# Patient Record
Sex: Female | Born: 1995 | Race: White | Hispanic: No | Marital: Single | Smoking: Never smoker
Health system: Southern US, Community
[De-identification: ages and names within clinical notes are randomized; demographics above are authoritative.]

## PROBLEM LIST (undated history)

## (undated) DIAGNOSIS — F419 Anxiety disorder, unspecified: Secondary | ICD-10-CM

## (undated) DIAGNOSIS — F32A Depression, unspecified: Secondary | ICD-10-CM

## (undated) DIAGNOSIS — G43909 Migraine, unspecified, not intractable, without status migrainosus: Secondary | ICD-10-CM

## (undated) HISTORY — PX: WISDOM TOOTH EXTRACTION: SHX21

---
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
## 2015-05-20 DIAGNOSIS — R39198 Other difficulties with micturition: Secondary | ICD-10-CM | POA: Diagnosis not present

## 2015-05-20 DIAGNOSIS — Z1389 Encounter for screening for other disorder: Secondary | ICD-10-CM | POA: Diagnosis not present

## 2015-05-20 DIAGNOSIS — R3919 Other difficulties with micturition: Secondary | ICD-10-CM | POA: Diagnosis not present

## 2015-05-20 DIAGNOSIS — Z68.41 Body mass index (BMI) pediatric, less than 5th percentile for age: Secondary | ICD-10-CM | POA: Diagnosis not present

## 2015-06-24 DIAGNOSIS — J301 Allergic rhinitis due to pollen: Secondary | ICD-10-CM | POA: Diagnosis not present

## 2015-06-24 DIAGNOSIS — Z1389 Encounter for screening for other disorder: Secondary | ICD-10-CM | POA: Diagnosis not present

## 2015-06-24 DIAGNOSIS — J019 Acute sinusitis, unspecified: Secondary | ICD-10-CM | POA: Diagnosis not present

## 2015-06-24 DIAGNOSIS — Z68.41 Body mass index (BMI) pediatric, less than 5th percentile for age: Secondary | ICD-10-CM | POA: Diagnosis not present

## 2015-08-18 DIAGNOSIS — H5200 Hypermetropia, unspecified eye: Secondary | ICD-10-CM | POA: Diagnosis not present

## 2015-08-18 DIAGNOSIS — H52229 Regular astigmatism, unspecified eye: Secondary | ICD-10-CM | POA: Diagnosis not present

## 2015-08-21 DIAGNOSIS — Z1389 Encounter for screening for other disorder: Secondary | ICD-10-CM | POA: Diagnosis not present

## 2015-08-21 DIAGNOSIS — Z0000 Encounter for general adult medical examination without abnormal findings: Secondary | ICD-10-CM | POA: Diagnosis not present

## 2015-08-21 DIAGNOSIS — H18419 Arcus senilis, unspecified eye: Secondary | ICD-10-CM | POA: Diagnosis not present

## 2015-08-21 DIAGNOSIS — Z68.41 Body mass index (BMI) pediatric, less than 5th percentile for age: Secondary | ICD-10-CM | POA: Diagnosis not present

## 2015-10-23 DIAGNOSIS — Z68.41 Body mass index (BMI) pediatric, less than 5th percentile for age: Secondary | ICD-10-CM | POA: Diagnosis not present

## 2015-10-23 DIAGNOSIS — Z23 Encounter for immunization: Secondary | ICD-10-CM | POA: Diagnosis not present

## 2015-10-23 DIAGNOSIS — Z1389 Encounter for screening for other disorder: Secondary | ICD-10-CM | POA: Diagnosis not present

## 2015-10-23 DIAGNOSIS — Z124 Encounter for screening for malignant neoplasm of cervix: Secondary | ICD-10-CM | POA: Diagnosis not present

## 2015-10-23 DIAGNOSIS — Z0000 Encounter for general adult medical examination without abnormal findings: Secondary | ICD-10-CM | POA: Diagnosis not present

## 2015-10-26 DIAGNOSIS — Z23 Encounter for immunization: Secondary | ICD-10-CM | POA: Diagnosis not present

## 2015-10-26 DIAGNOSIS — Z1389 Encounter for screening for other disorder: Secondary | ICD-10-CM | POA: Diagnosis not present

## 2015-10-26 DIAGNOSIS — Z0000 Encounter for general adult medical examination without abnormal findings: Secondary | ICD-10-CM | POA: Diagnosis not present

## 2015-11-04 ENCOUNTER — Telehealth: Payer: Self-pay | Admitting: Family Medicine

## 2015-11-04 NOTE — Telephone Encounter (Signed)
Needs copy of shot records.  Has currently enrolled in nursing school.  Please keep and see if there are any records

## 2015-11-04 NOTE — Telephone Encounter (Signed)
Please call and let patient know shot record is placed up front of pick up.  She need a second MMR and Varicella, need to check with school to make sure if she need those immunizations.  Derl Barrow, RN

## 2015-12-28 DIAGNOSIS — Z0000 Encounter for general adult medical examination without abnormal findings: Secondary | ICD-10-CM | POA: Diagnosis not present

## 2016-03-08 DIAGNOSIS — R51 Headache: Secondary | ICD-10-CM | POA: Diagnosis not present

## 2016-03-08 DIAGNOSIS — Z68.41 Body mass index (BMI) pediatric, less than 5th percentile for age: Secondary | ICD-10-CM | POA: Diagnosis not present

## 2016-03-08 DIAGNOSIS — J302 Other seasonal allergic rhinitis: Secondary | ICD-10-CM | POA: Diagnosis not present

## 2016-03-08 MED FILL — TRAMADOL-APAP 37.5-325 TAB: 37.5-325 | 10 days supply | Qty: 60 | Fill #0

## 2016-11-03 DIAGNOSIS — R51 Headache: Secondary | ICD-10-CM | POA: Diagnosis not present

## 2016-11-03 DIAGNOSIS — Z68.41 Body mass index (BMI) pediatric, less than 5th percentile for age: Secondary | ICD-10-CM | POA: Diagnosis not present

## 2016-11-03 DIAGNOSIS — Z308 Encounter for other contraceptive management: Secondary | ICD-10-CM | POA: Diagnosis not present

## 2016-11-03 DIAGNOSIS — Z1389 Encounter for screening for other disorder: Secondary | ICD-10-CM | POA: Diagnosis not present

## 2016-11-04 DIAGNOSIS — Z113 Encounter for screening for infections with a predominantly sexual mode of transmission: Secondary | ICD-10-CM | POA: Diagnosis not present

## 2016-11-21 DIAGNOSIS — Z23 Encounter for immunization: Secondary | ICD-10-CM | POA: Diagnosis not present

## 2016-12-21 DIAGNOSIS — Z6820 Body mass index (BMI) 20.0-20.9, adult: Secondary | ICD-10-CM | POA: Diagnosis not present

## 2016-12-21 DIAGNOSIS — Z1389 Encounter for screening for other disorder: Secondary | ICD-10-CM | POA: Diagnosis not present

## 2016-12-21 DIAGNOSIS — G44229 Chronic tension-type headache, not intractable: Secondary | ICD-10-CM | POA: Diagnosis not present

## 2016-12-30 ENCOUNTER — Ambulatory Visit (HOSPITAL_COMMUNITY)
Admission: EM | Admit: 2016-12-30 | Discharge: 2016-12-30 | Disposition: A | Discharge status: Home / Self Care | Payer: 59 | Attending: Family Medicine | Admitting: Family Medicine

## 2016-12-30 ENCOUNTER — Encounter (HOSPITAL_COMMUNITY): Payer: Self-pay | Admitting: Emergency Medicine

## 2016-12-30 ENCOUNTER — Ambulatory Visit (INDEPENDENT_AMBULATORY_CARE_PROVIDER_SITE_OTHER): Payer: 59

## 2016-12-30 DIAGNOSIS — W19XXXA Unspecified fall, initial encounter: Secondary | ICD-10-CM

## 2016-12-30 DIAGNOSIS — M79671 Pain in right foot: Secondary | ICD-10-CM

## 2016-12-30 MED ORDER — NAPROXEN 500 MG PO TABS: 500.0000 mg | ORAL_TABLET | Freq: Two times a day (BID) | ORAL | 0 refills | Status: AC

## 2016-12-30 NOTE — ED Triage Notes (Signed)
PT fell down 3 stairs last night and injured right foot. PT reports pain under arch and pain in right 5th toe.

## 2016-12-30 NOTE — ED Provider Notes (Signed)
[facility]    CSN: [id number] Arrival date & time: 12/30/16  1601     History   Chief Complaint Chief Complaint  Patient presents with  . Fall    HPI Diane Bautista is a 21 y.o. female.   21 year old female comes in for right 5th toe and arch pain after falling off the stairs yesterday. She states it was down carpeted stairs and she was not wearing shoes at the time. There is some swelling of the fifth toe with contusion seen. She has not taken anything for the pain. Would like an x-ray to make sure there is no fractures or dislocations. She is able to bear weight, walk without a problem.      History reviewed. No pertinent past medical history.  There are no active problems to display for this patient.   History reviewed. No pertinent surgical history.  OB History    No data available       Home Medications    Prior to Admission medications   Medication Sig Start Date End Date Taking? Authorizing Provider  amitriptyline (ELAVIL) 25 MG tablet Take 25 mg by mouth at bedtime.   Yes [provider]  naproxen (NAPROSYN) 500 MG tablet Take 1 tablet (500 mg total) by mouth 2 (two) times daily. 12/30/16 01/09/17  Belinda Fisher, PA-C    Family History No family history on file.  Social History Social History  Substance Use Topics  . Smoking status: Never Smoker  . Smokeless tobacco: Never Used  . Alcohol use Yes     Allergies   Patient has no known allergies.   Review of Systems Review of Systems  Musculoskeletal: Positive for arthralgias, joint swelling and myalgias. Negative for back pain and gait problem.  Skin: Positive for color change. Negative for wound.     Physical Exam Triage Vital Signs ED Triage Vitals  Enc Vitals Group     BP 12/30/16 1628 109/69     Pulse Rate 12/30/16 1628 91     Resp 12/30/16 1628 16     Temp 12/30/16 1628 98.8 F (37.1 C)     Temp Source 12/30/16 1628 Oral     SpO2 12/30/16 1628 100 %     Weight  12/30/16 1626 110 lb (49.9 kg)     Height 12/30/16 1626 5\' 2"  (1.575 m)     Head Circumference --      Peak Flow --      Pain Score 12/30/16 1626 2     Pain Loc --      Pain Edu? --      Excl. in GC? --    No data found.   Updated Vital Signs BP 109/69   Pulse 91   Temp 98.8 F (37.1 C) (Oral)   Resp 16   Ht 5\' 2"  (1.575 m)   Wt 110 lb (49.9 kg)   LMP 12/16/2016   SpO2 100%   BMI 20.12 kg/m      Physical Exam  Constitutional: She is oriented to person, place, and time. She appears well-developed and well-nourished. No distress.  Musculoskeletal:  Right fifth toe swelling and contusion noted. Mild pain on palpation of the right fifth toe and right fifth metatarsal. Full range of motion of the toes. Pedal pulses 2+ and equal bilaterally.  Neurological: She is alert and oriented to person, place, and time.     UC Treatments / Results  Labs (all labs ordered are listed,  but only abnormal results are displayed) Labs Reviewed - No data to display  EKG  EKG Interpretation None       Radiology Dg Foot Complete Right  Result Date: 12/30/2016 CLINICAL DATA:  21 year old female with right foot pain status post fall. EXAM: RIGHT FOOT COMPLETE - 3+ VIEW COMPARISON:  Radiographs of the right ankle 04/24/2008 FINDINGS: There is no evidence of fracture or dislocation. There is no evidence of arthropathy or other focal bone abnormality. Soft tissues are unremarkable. IMPRESSION: Negative. Electronically Signed   By: Sande Brothers M.D.   On: 12/30/2016 17:07    Procedures Procedures (including critical care time)  Medications Ordered in UC Medications - No data to display   Initial Impression / Assessment and Plan / UC Course  I have reviewed the triage vital signs and the nursing notes.  Pertinent labs & imaging results that were available during my care of the patient were reviewed by me and considered in my medical decision making (see chart for details).      Discussed risks and benefits of xray, as patient is not highly symptomatic, patient would still like to proceed with xray. X-ray negative for dislocations or fracture. Start NSAIDs as directed. Ice/heat compress, elevation. Ace wrap during activity. Discussed with patient this can take up to 2-3 weeks to completely resolve, but should be feeling better each week. Return precautions given.  Final Clinical Impressions(s) / UC Diagnoses   Final diagnoses:  Fall, initial encounter  Right foot pain    New Prescriptions New Prescriptions   NAPROXEN (NAPROSYN) 500 MG TABLET    Take 1 tablet (500 mg total) by mouth 2 (two) times daily.       Smithie, Ackerman, PA-C 12/30/16 1735

## 2016-12-30 NOTE — Discharge Instructions (Signed)
X-ray negative for fractures or dislocation. Start naproxen as directed. Ice compress and elevation. Compression during activity. This can take up to 2-3 weeks to completely resolve, but he should be feeling better each week. If experiencing worsening of symptoms, increased numbness/tingling, increased swelling, follow-up with PCP for further evaluation and treatment needed.

## 2017-01-31 DIAGNOSIS — Z111 Encounter for screening for respiratory tuberculosis: Secondary | ICD-10-CM | POA: Diagnosis not present

## 2017-03-09 DIAGNOSIS — G43009 Migraine without aura, not intractable, without status migrainosus: Secondary | ICD-10-CM | POA: Diagnosis not present

## 2017-03-09 DIAGNOSIS — R42 Dizziness and giddiness: Secondary | ICD-10-CM | POA: Diagnosis not present

## 2017-03-09 DIAGNOSIS — Z6820 Body mass index (BMI) 20.0-20.9, adult: Secondary | ICD-10-CM | POA: Diagnosis not present

## 2017-03-09 DIAGNOSIS — Z1389 Encounter for screening for other disorder: Secondary | ICD-10-CM | POA: Diagnosis not present

## 2017-09-26 ENCOUNTER — Ambulatory Visit: Payer: Self-pay | Admitting: General Surgery

## 2017-10-02 DIAGNOSIS — H6123 Impacted cerumen, bilateral: Secondary | ICD-10-CM | POA: Diagnosis not present

## 2017-10-02 DIAGNOSIS — Z6820 Body mass index (BMI) 20.0-20.9, adult: Secondary | ICD-10-CM | POA: Diagnosis not present

## 2017-10-17 ENCOUNTER — Ambulatory Visit: Payer: 59 | Admitting: General Surgery

## 2017-10-17 ENCOUNTER — Encounter: Payer: Self-pay | Admitting: General Surgery

## 2017-10-17 VITALS — BP 111/63 | HR 88 | Temp 98.0°F | Resp 16 | Wt 111.0 lb

## 2017-10-17 DIAGNOSIS — N6310 Unspecified lump in the right breast, unspecified quadrant: Secondary | ICD-10-CM

## 2017-10-17 NOTE — Patient Instructions (Signed)
Fibroadenoma Fibroadenoma is a type of breast tumor that is not cancerous (is benign). These tumors are made up of breast tissue and the tissue that holds breast tissue together (connective tissue). There are several types of fibroadenomas:  Simple fibroadenoma. This is the most common type. It consists of a single type of tissue throughout the tumor.  Complex fibroadenoma. This type of tumor contains more than one kind of tissue or irregular tissue.  Juvenile fibroadenoma. This is a type of tumor that can develop in adolescent girls. It tends to grow larger over time than other adenomas.  A fibroadenoma usually occurs as a single lump, but sometimes there may be more than one lump. Fibroadenomas vary in size. They can occur in one breast or in both breasts. Some fibroadenomas are too small to feel, but a larger one may feel like a firm, smooth lump that moves beneath your fingers. Although fibroadenomas are not cancer, having a fibroadenoma may slightly increase your risk for developing breast cancer in the future. What are the causes? The exact cause of fibroadenoma is not known. What increases the risk? This condition is more likely to develop in:  Women who are 20-30 years of age.  Women of African-American descent.  What are the signs or symptoms? A fibroadenoma may not cause any symptoms. These tumors usually do not cause pain unless they grow to a large size. A fibroadenoma may feel like a lump in your breast that is:  Firm.  Round.  Smooth.  Slightly moveable.  How is this diagnosed? You may notice a breast lump during a breast self-exam. Your health care provider may discover it during a routine breast exam or mammogram. Your health care provider may suspect fibroadenoma if you have a breast lump that feels firm, round, and smooth and appears smooth on your mammogram. Other tests may be done to confirm the diagnosis, including:  An ultrasound to check for fluid inside the  lump (cystic tumor).  A procedure that uses a needle to remove fluid from a cystic tumor. The fluid is then checked under a microscope for cancer cells.  A mammogram to examine a lump that is not cystic (is solid).  A procedure that uses a needle to remove a sample of tissue from the lump (breast biopsy) to examine under a microscope. This test is the only method that can be used to confirm that a tumor is a fibroadenoma and is not cancer.  How is this treated? Treatment for this condition may include:  Having breast exams regularly to check for changes in your fibroadenoma.  Having the fibroadenoma removed. A fibroadenoma may be removed if it is: ? Large. ? Continuing to grow. ? Causing symptoms. ? Changing the appearance of your breast. ? A juvenile fibroadenoma. These tend to grow large over time.  Follow these instructions at home:   If you had a fibroadenoma removed, follow instructions from your health care provider for care after the procedure.  Perform breast self-exams at home as told by your health care provider.  Keep all follow-up visits as told by your health care provider. This is important. Contact a health care provider if:  Your fibroadenoma becomes larger, feels different, or becomes painful.  You find a new breast lump.  You have any changes in the skin that covers your breast. These include: ? Dimpling. ? Bruising. ? Thickening. ? Redness.  You have any changes in your nipple.  You have fluid leaking from your nipple. This   information is not intended to replace advice given to you by your health care provider. Make sure you discuss any questions you have with your health care provider. Document Released: 09/16/2014 Document Revised: 10/08/2015 Document Reviewed: 04/23/2014 Elsevier Interactive Patient Education  2018 [employer]vier Inc.  

## 2017-10-17 NOTE — Progress Notes (Signed)
Diane Bautista; [id number]; 06/03/1995   HPI Patient is a 22 year old white female who was referred to my care by Dr. Sherwood GamblerFusco for evaluation and treatment of a right breast mass.  Patient found the mass approximately 4 weeks ago.  Is nontender to touch.  No nipple discharge has been noted.  There is no immediate family history of breast cancer.  Patient denies any pain in the right breast.  Her menstrual cycles have been normal. History reviewed. No pertinent past medical history.  History reviewed. No pertinent surgical history.  History reviewed. No pertinent family history.  Current Outpatient Medications on File Prior to Visit  Medication Sig Dispense Refill  . rizatriptan (MAXALT) 5 MG tablet Take 5 mg by mouth as needed for migraine. May repeat in 2 hours if needed    . amitriptyline (ELAVIL) 25 MG tablet Take 25 mg by mouth at bedtime.     No current facility-administered medications on file prior to visit.     No Known Allergies  Social History   Substance and Sexual Activity  Alcohol Use Yes    Social History   Tobacco Use  Smoking Status Never Smoker  Smokeless Tobacco Never Used    Review of Systems  Constitutional: Negative.   HENT: Negative.   Eyes: Negative.   Respiratory: Negative.   Cardiovascular: Negative.   Gastrointestinal: Negative.   Genitourinary: Negative.   Musculoskeletal: Negative.   Skin: Negative.   Neurological: Negative.   Endo/Heme/Allergies: Negative.   Psychiatric/Behavioral: Negative.    Objective   Vitals:   10/17/17 0929  BP: 111/63  Pulse: 88  Resp: 16  Temp: 98 F (36.7 C)    Physical Exam  Constitutional: She is oriented to person, place, and time. She appears well-developed and well-nourished.  HENT:  Head: Normocephalic and atraumatic.  Cardiovascular: Normal rate, regular rhythm and normal heart sounds. Exam reveals no gallop and no friction rub.  No murmur heard. Pulmonary/Chest: Effort normal and breath sounds  normal. No stridor. No respiratory distress. She has no wheezes. She has no rales.  Neurological: She is alert and oriented to person, place, and time.  Skin: Skin is warm and dry.  Vitals reviewed. Breast: Right breast examination reveals a smaller than pea-sized mobile, well-circumscribed nodule at the 6 o'clock position in the right breast.  It is nontender to touch.  No nipple discharge or dimpling is noted.  The axilla is negative for palpable nodes.  Left breast examination reveals no dominant mass, nipple discharge, dimpling.  The axilla was negative for palpable nodes. Dr. Sharyon MedicusFusco's notes reviewed.  Assessment  Benign right breast mass, probable resolved cyst or fibroadenoma Plan   I reassured the patient that this is a benign finding.  I did offer excision if she wanted, but she does not.  She will follow the lump and return to my care should there be any increase in size.  Mammogram and ultrasound will not be beneficial at this point given her age and the small size of the lump.  Follow-up as needed.

## 2019-02-22 ENCOUNTER — Ambulatory Visit (HOSPITAL_COMMUNITY)
Admission: EM | Admit: 2019-02-22 | Discharge: 2019-02-22 | Disposition: A | Discharge status: Home / Self Care | Payer: No Typology Code available for payment source | Attending: Emergency Medicine | Admitting: Emergency Medicine

## 2019-02-22 ENCOUNTER — Other Ambulatory Visit: Payer: Self-pay

## 2019-02-22 ENCOUNTER — Encounter (HOSPITAL_COMMUNITY): Payer: Self-pay | Admitting: Emergency Medicine

## 2019-02-22 DIAGNOSIS — N939 Abnormal uterine and vaginal bleeding, unspecified: Secondary | ICD-10-CM | POA: Diagnosis not present

## 2019-02-22 DIAGNOSIS — Z3202 Encounter for pregnancy test, result negative: Secondary | ICD-10-CM

## 2019-02-22 LAB — POCT PREGNANCY, URINE: Preg Test, Ur: NEGATIVE

## 2019-02-22 MED ORDER — NAPROXEN 500 MG PO TABS: 500.0000 mg | ORAL_TABLET | Freq: Two times a day (BID) | ORAL | 0 refills | Status: AC

## 2019-02-22 NOTE — ED Triage Notes (Signed)
Pt states shes had the nuva ring two months ago, states for the last week shes had brown spotting, and started having cramping, started to bleed red last night with large pieces. She took the nuva ring out and still having bleeding. Cramping stopped after taking out the nuva ring.

## 2019-02-22 NOTE — Discharge Instructions (Addendum)
[facility]    [facility]       Phone: [phone]  [facility]   Phone: [phone]  [facility]  Phone: [phone]  [facility]  Phone: [phone]  [facility]  Phone: [phone]  [facility]   Phone: [phone]  [facility]       Phone: [phone]  [facility]    Phone: [phone]   [facility]    Phone: [phone]  [facility]    Phone: [phone]  [facility]    Phone: [phone]  [facility]       Phone: [phone]   [facility]  Phone: [phone]  [facility]    Phone: [phone]  [facility]   Phone: [phone]  [facility]      Phone: [phone]  [facility]    Phone: [phone]   

## 2019-02-22 NOTE — ED Provider Notes (Signed)
HPI  SUBJECTIVE:  Diane Bautista is a 23 y.o. female who presents with brown vaginal spotting for the past week.  She states that she started having bright red bleeding from her vagina last night, so took her NuvaRing out.  Today she reports severe lower abdominal cramping and having the urge to defecate today, she states that she felt "a gush of" blood, and passed a large, dense mass.  the cramping then completely resolved. Wonders if she passed a fibroid. She states that she is still having bright red vaginal bleeding, but is getting less.  She is going through less than 1 pad per hour.  She tried removing the NuvaRing, taking Excedrin and Gas-X.  Excedrin helps.  No aggravating factors.  Last dose of Excedrin was within 4 to 6 hours of evaluation.  She states that she had a NuvaRing in last month, changed it out, but did not give it a week to have menses.  She denies chest pain, shortness of breath, lightheadedness, dizziness.  She has a past medical history of migraines.  No history of coagulopathy, uterine fibroids.  She does not have an OB/GYN, the NuvaRing is prescribed by her PMD. Cory Munch, PA-C     History reviewed. No pertinent past medical history.  History reviewed. No pertinent surgical history.  No family history on file.  Social History   Tobacco Use  . Smoking status: Never Smoker  . Smokeless tobacco: Never Used  Substance Use Topics  . Alcohol use: Yes  . Drug use: No    No current facility-administered medications for this encounter.   Current Outpatient Medications:  .  FLUoxetine (PROZAC) 20 MG tablet, Take 20 mg by mouth daily., Disp: , Rfl:  .  naproxen (NAPROSYN) 500 MG tablet, Take 1 tablet (500 mg total) by mouth 2 (two) times daily for 5 days., Disp: 10 tablet, Rfl: 0 .  rizatriptan (MAXALT) 5 MG tablet, Take 5 mg by mouth as needed for migraine. May repeat in 2 hours if needed, Disp: , Rfl:   No Known Allergies   ROS  As noted in HPI.    Physical Exam  BP (!) 128/99   Pulse 92   Temp 98.7 F (37.1 C)   Resp 18   SpO2 97%   Constitutional: Well developed, well nourished, no acute distress Eyes:  EOMI, conjunctiva normal bilaterally HENT: Normocephalic, atraumatic,mucus membranes moist Respiratory: Normal inspiratory effort Cardiovascular: normal rate GI: nondistended.  Flat, soft, nontender.  GU: Taken from patient's phone       skin: No rash, skin intact Musculoskeletal: no deformities Neurologic: Alert & oriented x 3, no focal neuro deficits Psychiatric: Speech and behavior appropriate   ED Course   Medications - No data to display  Orders Placed This Encounter  Procedures  . POC urine pregnancy    Standing Status:   Standing    Number of Occurrences:   1  . Pregnancy, urine POC    Standing Status:   Standing    Number of Occurrences:   1    Results for orders placed or performed during the hospital encounter of 02/22/19 (from the past 24 hour(s))  Pregnancy, urine POC     Status: None   Collection Time: 02/22/19  6:32 PM  Result Value Ref Range   Preg Test, Ur NEGATIVE NEGATIVE   No results found.  ED Clinical Impression  1. Vaginal bleeding      ED Assessment/Plan  Urine pregnancy negative.  Patient  passed a large clot versus mass.  It appears to be a mass such as a uterine fibroid.  She states that the vaginal bleeding is slowing down and that the cramping has completely resolved.  Will send home with Naprosyn 500 mg twice daily for the next 5 days to help with the bleeding and for any other future cramping.  We will give her an OB/GYN list.  Advised her that she needs a pelvic ultrasound.  Gave her ER return precautions.  Discussed labs,  MDM, treatment plan, and plan for follow-up with patient. Discussed sn/sx that should prompt return to the ED. patient agrees with plan.   Meds ordered this encounter  Medications  . naproxen (NAPROSYN) 500 MG tablet    Sig: Take 1 tablet (500  mg total) by mouth 2 (two) times daily for 5 days.    Dispense:  10 tablet    Refill:  0    *This clinic note was created using Scientist, clinical (histocompatibility and immunogenetics). Therefore, there may be occasional mistakes despite careful proofreading.   ?   Domenick Gong, MD 02/23/19 [phone]

## 2019-04-29 MED FILL — ESCITALOPRAM 20 MG TABLET: 20 | 30 days supply | Qty: 30 | Fill #0

## 2019-05-31 MED FILL — ESCITALOPRAM 20 MG TABLET: 20 | 30 days supply | Qty: 30 | Fill #1

## 2019-06-04 MED FILL — ESCITALOPRAM 5 MG TABLET: 5 | 7 days supply | Qty: 7 | Fill #0

## 2019-06-26 MED FILL — ETONOGESTREL-ETHINYL ESTRAD: 0.12-0.015 | 28 days supply | Qty: 1 | Fill #0

## 2019-07-15 ENCOUNTER — Telehealth: Payer: Self-pay | Admitting: Neurology

## 2019-07-15 NOTE — Telephone Encounter (Signed)
Nikki, I ned to squeeze this patient in on Wednesday. Would you have any problem if I rescheduled my Wednesday 11am or 1pm to you (at a later time when you have an opening)  Let me know thanks   Ladona Ridgel, if Jamica agrees can you reschedule my 11am or 1pm of Wednesday to Charleen and call this patient to schedule please?   Toma Copier [facility]!

## 2019-07-15 NOTE — Telephone Encounter (Signed)
Attempting to help. I have been unable to get in touch with the 11 AM and 1 PM of this Wednesday.

## 2019-07-15 NOTE — Telephone Encounter (Signed)
I am happy to help.

## 2019-07-16 NOTE — Telephone Encounter (Signed)
I was able to contact the 11AM for 3/3. Jake had an opening for tomorrow at 2:30PM. Patient was fine seeing Diane Bautista at that time. I put the 11AM spot on hold for Diane Bautista. I called her to try to schedule her for 11AM but she did not answer. LVM with my direct line asking to call back. FYI

## 2019-07-16 NOTE — Telephone Encounter (Signed)
Noted thanks °

## 2019-07-16 NOTE — Telephone Encounter (Signed)
Pt returned call and accepted the 11am appt and will be checking in at 10:30am.

## 2019-07-17 ENCOUNTER — Ambulatory Visit (INDEPENDENT_AMBULATORY_CARE_PROVIDER_SITE_OTHER): Payer: No Typology Code available for payment source | Admitting: Neurology

## 2019-07-17 ENCOUNTER — Other Ambulatory Visit: Payer: Self-pay

## 2019-07-17 ENCOUNTER — Ambulatory Visit: Payer: No Typology Code available for payment source | Admitting: Neurology

## 2019-07-17 ENCOUNTER — Encounter: Payer: Self-pay | Admitting: Neurology

## 2019-07-17 VITALS — BP 114/68 | HR 92 | Temp 97.6°F | Ht 62.0 in | Wt 127.0 lb

## 2019-07-17 DIAGNOSIS — G43711 Chronic migraine without aura, intractable, with status migrainosus: Secondary | ICD-10-CM

## 2019-07-17 DIAGNOSIS — H538 Other visual disturbances: Secondary | ICD-10-CM | POA: Diagnosis not present

## 2019-07-17 DIAGNOSIS — R51 Headache with orthostatic component, not elsewhere classified: Secondary | ICD-10-CM | POA: Diagnosis not present

## 2019-07-17 DIAGNOSIS — R519 Headache, unspecified: Secondary | ICD-10-CM

## 2019-07-17 DIAGNOSIS — F32A Depression, unspecified: Secondary | ICD-10-CM

## 2019-07-17 DIAGNOSIS — F329 Major depressive disorder, single episode, unspecified: Secondary | ICD-10-CM

## 2019-07-17 DIAGNOSIS — F419 Anxiety disorder, unspecified: Secondary | ICD-10-CM

## 2019-07-17 DIAGNOSIS — G43709 Chronic migraine without aura, not intractable, without status migrainosus: Secondary | ICD-10-CM

## 2019-07-17 NOTE — Progress Notes (Signed)
[facility]    Provider:  Dr Jaynee Eagles Requesting Provider: Per patient initiation Primary Care Provider:  Cory Munch, PA-C  CC:  Migraines  HPI:  Diane Bautista is a 24 y.o. female here as requested by Cory Munch, PA-C for Migraines. PMHx migraines, anxiety and depression. She has a frontal headache on both sides. She can wake up most days with headaches and blurry vision and it takes hours for vision to get better. Ongoing for 5 years. In the evenings she doesn't sleep well, in the evening she still has headaches and migraines. She has examined food triggers except for mountain dew which she does not drink anymore often, she has headaches every day and most often wakes with them. Migraines are pounding, motion sickness, nausea, light and sound sensitivity, a dark room helps a little but not much, they can last 4-72 hours they can usually last 24 hours and if she goes to sleep with one she wakes up with one. Drmamine helps with the nausea and motion sickness. She also has depression. She has 30 headache days a month, 10-15 days of moderately-severe to severe migraines ongoing at this frequency and severity for at least over a year. Movement makes it worse, bending over makes it worse. She has blurred vision. No aura. No medication overuse. She has ried and failed all OTC meds, naproxen, alleve, tylenol and many others Rizatriptan, excedrin. Preventative tried lexapro, amitriptyline, prozac. Lexapro did help migraines. Mom has migraines. No other focal neurologic deficits, associated symptoms, inciting events or modifiable factors.  Review of Systems: Patient complains of symptoms per HPI as well as the following symptoms: anxiety and depression. Pertinent negatives and positives per HPI. All others negative.   Social History   Socioeconomic History  . Marital status: Single    Spouse name: Not on file  . Number of children: Not on file  . Years of education: Not on  file  . Highest education level: Associate degree: occupational, Hotel manager, or vocational program  Occupational History  . Not on file  Tobacco Use  . Smoking status: Never Smoker  . Smokeless tobacco: Never Used  Substance and Sexual Activity  . Alcohol use: Yes    Alcohol/week: 4.0 standard drinks    Types: 4 Glasses of wine per week  . Drug use: No  . Sexual activity: Not on file    Comment: nuvaring  Other Topics Concern  . Not on file  Social History Narrative   Lives at home alone   Right handed   Caffeine: diet mtn dew in the morning or another soda/coffee (up to 20 oz), also drinks 8 oz can mid-day while working. She gets tension headaches in the back of her head if she doesn't drink caffeine.   Social Determinants of Health   Financial Resource Strain:   . Difficulty of Paying Living Expenses: Not on file  Food Insecurity:   . Worried About Charity fundraiser in the Last Year: Not on file  . Ran Out of Food in the Last Year: Not on file  Transportation Needs:   . Lack of Transportation (Medical): Not on file  . Lack of Transportation (Non-Medical): Not on file  Physical Activity:   . Days of Exercise per Week: Not on file  . Minutes of Exercise per Session: Not on file  Stress:   . Feeling of Stress : Not on file  Social Connections:   . Frequency of Communication with Friends and Family: Not  on file  . Frequency of Social Gatherings with Friends and Family: Not on file  . Attends Religious Services: Not on file  . Active Member of Clubs or Organizations: Not on file  . Attends Banker Meetings: Not on file  . Marital Status: Not on file  Intimate Partner Violence:   . Fear of Current or Ex-Partner: Not on file  . Emotionally Abused: Not on file  . Physically Abused: Not on file  . Sexually Abused: Not on file    Family History  Problem Relation Age of Onset  . Migraines Mother   . Other Mother        borderline hypertension  . High  Cholesterol Father   . Colon cancer Maternal Grandmother   . Diabetes Paternal Grandmother   . Depression Other     History reviewed. No pertinent past medical history.  Patient Active Problem List   Diagnosis Date Noted  . Chronic migraine without aura without status migrainosus, not intractable 07/19/2019  . Anxiety and depression 07/19/2019  . Chronic migraine without aura, with intractable migraine, so stated, with status migrainosus 07/19/2019    Past Surgical History:  Procedure Laterality Date  . WISDOM TOOTH EXTRACTION      Current Outpatient Medications  Medication Sig Dispense Refill  . dimenhyDRINATE (DRAMAMINE PO) Take by mouth as needed.    . etonogestrel-ethinyl estradiol (NUVARING) 0.12-0.015 MG/24HR vaginal ring Place 1 each vaginally every 28 (twenty-eight) days. Insert vaginally and leave in place for 3 consecutive weeks, then remove for 1 week.    . Fremanezumab-vfrm (AJOVY) 225 MG/1.5ML SOAJ Inject 225 mg into the skin every 30 (thirty) days. 1 pen 11  . Rimegepant Sulfate (NURTEC) 75 MG TBDP Take 75 mg by mouth daily as needed. For migraines. Take as close to onset of migraine as possible. One daily maximum. 10 tablet 6   No current facility-administered medications for this visit.    Allergies as of 07/17/2019  . (No Known Allergies)    Vitals: BP 114/68 (BP Location: Right Arm, Patient Position: Sitting)   Pulse 92   Temp 97.6 F (36.4 C) Comment: taken at front  Ht 5\' 2"  (1.575 m)   Wt 127 lb (57.6 kg)   BMI 23.23 kg/m  Last Weight:  Wt Readings from Last 1 Encounters:  07/17/19 127 lb (57.6 kg)   Last Height:   Ht Readings from Last 1 Encounters:  07/17/19 5\' 2"  (1.575 m)     Physical exam: Exam: Gen: NAD, conversant, well nourised, well groomed                     CV: RRR, no MRG. No Carotid Bruits. No peripheral edema, warm, nontender Eyes: Conjunctivae clear without exudates or hemorrhage  Neuro: Detailed Neurologic  Exam  Speech:    Speech is normal; fluent and spontaneous with normal comprehension.  Cognition:    The patient is oriented to person, place, and time;     recent and remote memory intact;     language fluent;     normal attention, concentration,     fund of knowledge Cranial Nerves:    The pupils are equal, round, and reactive to light. The fundi are normal and spontaneous venous pulsations are present. Visual fields are full to finger confrontation. Extraocular movements are intact. Trigeminal sensation is intact and the muscles of mastication are normal. The face is symmetric. The palate elevates in the midline. Hearing intact. Voice is normal. Shoulder  shrug is normal. The tongue has normal motion without fasciculations.   Coordination:    Normal finger to nose and heel to shin. Normal rapid alternating movements.   Gait:    Heel-toe and tandem gait are normal.   Motor Observation:    No asymmetry, no atrophy, and no involuntary movements noted. Tone:    Normal muscle tone.    Posture:    Posture is normal. normal erect    Strength:    Strength is V/V in the upper and lower limbs.      Sensation: intact to LT     Reflex Exam:  DTR's:    Deep tendon reflexes in the upper and lower extremities are normal bilaterally.   Toes:    The toes are downgoing bilaterally.   Clonus:    Clonus is absent.    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 We spent a long time discussing migraine management and anxiety/depression. However given her concerning symptoms we need further evaluation. Migraine:  Start Ajovy for prevention Take Nurtec for the next 2 weeks daily and then as needed Cbc,cmp,tsh and MRi of the brain MRI brain due to concerning symptoms of morning headaches, positional headaches,vision changes  to look for space occupying mass, chiari or intracranial hypertension (pseudotumor).  Encouraged her to seek psychiatry and therapy  for ongoing depression and anxiety  Discussed: To prevent or relieve headaches, try the following: Cool Compress. Lie down and place a cool compress on your head.  Avoid headache triggers. If certain foods or odors seem to have triggered your migraines in the past, avoid them. A headache diary might help you identify triggers.  Include physical activity in your daily routine. Try a daily walk or other moderate aerobic exercise.  Manage stress. Find healthy ways to cope with the stressors, such as delegating tasks on your to-do list.  Practice relaxation techniques. Try deep breathing, yoga, massage and visualization.  Eat regularly. Eating regularly scheduled meals and maintaining a healthy diet might help prevent headaches. Also, drink plenty of fluids.  Follow a regular sleep schedule. Sleep deprivation might contribute to headaches Consider biofeedback. With this mind-body technique, you learn to control certain bodily functions -- such as muscle tension, heart rate and blood pressure -- to prevent headaches or reduce headache pain.    Proceed to emergency room if you experience new or worsening symptoms or symptoms do not resolve, if you have new neurologic symptoms or if headache is severe, or for any concerning symptom.   Provided education and documentation from American headache Society toolbox including articles on: chronic migraine medication overuse headache, chronic migraines, prevention of migraines, behavioral and other nonpharmacologic treatments for headache.    Orders Placed This Encounter  Procedures  . MR BRAIN W WO CONTRAST  . CBC  . Comprehensive metabolic panel  . TSH   Meds ordered this encounter  Medications  . Fremanezumab-vfrm (AJOVY) 225 MG/1.5ML SOAJ    Sig: Inject 225 mg into the skin every 30 (thirty) days.    Dispense:  1 pen    Refill:  11    Patient has copay card; she can have medication for $5 regardless of insurance approval or copay amount.  .  Rimegepant Sulfate (NURTEC) 75 MG TBDP    Sig: Take 75 mg by mouth daily as needed. For migraines. Take as close to onset of migraine as possible. One daily maximum.    Dispense:  10 tablet    Refill:  6    Patient has copay card; she can have medication for $5 regardless of insurance approval or copay amount.   Discussed: To prevent or relieve headaches, try the following: Cool Compress. Lie down and place a cool compress on your head.  Avoid headache triggers. If certain foods or odors seem to have triggered your migraines in the past, avoid them. A headache diary might help you identify triggers.  Include physical activity in your daily routine. Try a daily walk or other moderate aerobic exercise.  Manage stress. Find healthy ways to cope with the stressors, such as delegating tasks on your to-do list.  Practice relaxation techniques. Try deep breathing, yoga, massage and visualization.  Eat regularly. Eating regularly scheduled meals and maintaining a healthy diet might help prevent headaches. Also, drink plenty of fluids.  Follow a regular sleep schedule. Sleep deprivation might contribute to headaches Consider biofeedback. With this mind-body technique, you learn to control certain bodily functions -- such as muscle tension, heart rate and blood pressure -- to prevent headaches or reduce headache pain.    Proceed to emergency room if you experience new or worsening symptoms or symptoms do not resolve, if you have new neurologic symptoms or if headache is severe, or for any concerning symptom.   Provided education and documentation from American headache Society toolbox including articles on: chronic migraine medication overuse headache, chronic migraines, prevention of migraines, behavioral and other nonpharmacologic treatments for headache.  Cc: Samuella Bruin,    Naomie Dean, MD  [facility] [address] [place], [place]  [address]  Phone [phone] Fax [phone]  I spent 60 minutes of face-to-face and non-face-to-face time with patient on the  1. Chronic migraine without aura, with intractable migraine, so stated, with status migrainosus   2. Positional headache   3. Morning headache   4. Blurry vision   5. Chronic migraine without aura without status migrainosus, not intractable   6. Anxiety and depression    diagnosis.  This included previsit chart review, lab review, study review, order entry, electronic health record documentation, patient education on the different diagnostic and therapeutic options, counseling and coordination of care, risks and benefits of management, compliance, or risk factor reduction

## 2019-07-17 NOTE — Patient Instructions (Addendum)
Start Ajovy Take Nurtec for the next 2 weeks daily and then as needed Cbc,cmp,tsh and MRi of the brain  Fremanezumab injection What is this medicine? FREMANEZUMAB (fre ma NEZ ue mab) is used to prevent migraine headaches. This medicine may be used for other purposes; ask your health care provider or pharmacist if you have questions. COMMON BRAND NAME(S): AJOVY What should I tell my health care provider before I take this medicine? They need to know if you have any of these conditions:  an unusual or allergic reaction to fremanezumab, other medicines, foods, dyes, or preservatives  pregnant or trying to get pregnant  breast-feeding How should I use this medicine? This medicine is for injection under the skin. You will be taught how to prepare and give this medicine. Use exactly as directed. Take your medicine at regular intervals. Do not take your medicine more often than directed. It is important that you put your used needles and syringes in a special sharps container. Do not put them in a trash can. If you do not have a sharps container, call your pharmacist or healthcare provider to get one. Talk to your pediatrician regarding the use of this medicine in children. Special care may be needed. Overdosage: If you think you have taken too much of this medicine contact a poison control center or emergency room at once. NOTE: This medicine is only for you. Do not share this medicine with others. What if I miss a dose? If you miss a dose, take it as soon as you can. If it is almost time for your next dose, take only that dose. Do not take double or extra doses. What may interact with this medicine? Interactions are not expected. This list may not describe all possible interactions. Give your health care provider a list of all the medicines, herbs, non-prescription drugs, or dietary supplements you use. Also tell them if you smoke, drink alcohol, or use illegal drugs. Some items may interact  with your medicine. What should I watch for while using this medicine? Tell your doctor or healthcare professional if your symptoms do not start to get better or if they get worse. What side effects may I notice from receiving this medicine? Side effects that you should report to your doctor or health care professional as soon as possible:  allergic reactions like skin rash, itching or hives, swelling of the face, lips, or tongue Side effects that usually do not require medical attention (report these to your doctor or health care professional if they continue or are bothersome):  pain, redness, or irritation at site where injected This list may not describe all possible side effects. Call your doctor for medical advice about side effects. You may report side effects to FDA at 1-800-FDA-1088. Where should I keep my medicine? Keep out of the reach of children. You will be instructed on how to store this medicine. Throw away any unused medicine after the expiration date on the label. NOTE: This sheet is a summary. It may not cover all possible information. If you have questions about this medicine, talk to your doctor, pharmacist, or health care provider.  2020 Elsevier/Gold Standard (2017-01-30 17:22:56) Rimegepant oral dissolving tablet What is this medicine? RIMEGEPANT (ri ME je pant) is used to treat migraine headaches with or without aura. An aura is a strange feeling or visual disturbance that warns you of an attack. It is not used to prevent migraines. This medicine may be used for other purposes; ask your health  care provider or pharmacist if you have questions. COMMON BRAND NAME(S): NURTEC ODT What should I tell my health care provider before I take this medicine? They need to know if you have any of these conditions:  kidney disease  liver disease  an unusual or allergic reaction to rimegepant, other medicines, foods, dyes, or preservatives  pregnant or trying to get  pregnant  breast-feeding How should I use this medicine? Take the medicine by mouth. Follow the directions on the prescription label. Leave the tablet in the sealed blister pack until you are ready to take it. With dry hands, open the blister and gently remove the tablet. If the tablet breaks or crumbles, throw it away and take a new tablet out of the blister pack. Place the tablet in the mouth and allow it to dissolve, and then swallow. Do not cut, crush, or chew this medicine. You do not need water to take this medicine. Talk to your pediatrician about the use of this medicine in children. Special care may be needed. Overdosage: If you think you have taken too much of this medicine contact a poison control center or emergency room at once. NOTE: This medicine is only for you. Do not share this medicine with others. What if I miss a dose? This does not apply. This medicine is not for regular use. What may interact with this medicine? This medicine may interact with the following medications:  certain medicines for fungal infections like fluconazole, itraconazole  rifampin This list may not describe all possible interactions. Give your health care provider a list of all the medicines, herbs, non-prescription drugs, or dietary supplements you use. Also tell them if you smoke, drink alcohol, or use illegal drugs. Some items may interact with your medicine. What should I watch for while using this medicine? Visit your health care professional for regular checks on your progress. Tell your health care professional if your symptoms do not start to get better or if they get worse. What side effects may I notice from receiving this medicine? Side effects that you should report to your doctor or health care professional as soon as possible:  allergic reactions like skin rash, itching or hives; swelling of the face, lips, or tongue Side effects that usually do not require medical attention (report  these to your doctor or health care professional if they continue or are bothersome):  nausea This list may not describe all possible side effects. Call your doctor for medical advice about side effects. You may report side effects to FDA at 1-800-FDA-1088. Where should I keep my medicine? Keep out of the reach of children. Store at room temperature between 15 and 30 degrees C (59 and 86 degrees F). Throw away any unused medicine after the expiration date. NOTE: This sheet is a summary. It may not cover all possible information. If you have questions about this medicine, talk to your doctor, pharmacist, or health care provider.  2020 Elsevier/Gold Standard (2018-07-16 00:21:31)

## 2019-07-18 ENCOUNTER — Telehealth: Payer: Self-pay | Admitting: Neurology

## 2019-07-18 LAB — COMPREHENSIVE METABOLIC PANEL
ALT: 12 IU/L (ref 0–32)
AST: 14 IU/L (ref 0–40)
Albumin/Globulin Ratio: 1.7 (ref 1.2–2.2)
Albumin: 4 g/dL (ref 3.9–5.0)
Alkaline Phosphatase: 56 IU/L (ref 39–117)
BUN/Creatinine Ratio: 15 (ref 9–23)
BUN: 11 mg/dL (ref 6–20)
Bilirubin Total: 0.2 mg/dL (ref 0.0–1.2)
CO2: 22 mmol/L (ref 20–29)
Calcium: 9.2 mg/dL (ref 8.7–10.2)
Chloride: 105 mmol/L (ref 96–106)
Creatinine, Ser: 0.75 mg/dL (ref 0.57–1.00)
GFR calc Af Amer: 130 mL/min/{1.73_m2} (ref >59)
GFR calc non Af Amer: 113 mL/min/{1.73_m2} (ref >59)
Globulin, Total: 2.3 g/dL (ref 1.5–4.5)
Glucose: 72 mg/dL (ref 65–99)
Potassium: 4 mmol/L (ref 3.5–5.2)
Sodium: 142 mmol/L (ref 134–144)
Total Protein: 6.3 g/dL (ref 6.0–8.5)

## 2019-07-18 LAB — CBC
Hematocrit: 42.7 % (ref 34.0–46.6)
Hemoglobin: 14.7 g/dL (ref 11.1–15.9)
MCH: 32.5 pg (ref 26.6–33.0)
MCHC: 34.4 g/dL (ref 31.5–35.7)
MCV: 94 fL (ref 79–97)
Platelets: 202 10*3/uL (ref 150–450)
RBC: 4.53 x10E6/uL (ref 3.77–5.28)
RDW: 11.9 % (ref 11.7–15.4)
WBC: 6.3 10*3/uL (ref 3.4–10.8)

## 2019-07-18 LAB — TSH: TSH: 0.681 u[IU]/mL (ref 0.450–4.500)

## 2019-07-18 NOTE — Telephone Encounter (Signed)
Patient returned my call she is scheduled at [facility] for 07/23/19. 

## 2019-07-18 NOTE — Telephone Encounter (Signed)
LVM for pt to call back about scheduling mri  [facility] auth: [id number] (exp. 07/23/19 to 08/22/19)

## 2019-07-19 DIAGNOSIS — F32A Depression, unspecified: Secondary | ICD-10-CM | POA: Insufficient documentation

## 2019-07-19 DIAGNOSIS — F329 Major depressive disorder, single episode, unspecified: Secondary | ICD-10-CM | POA: Insufficient documentation

## 2019-07-19 DIAGNOSIS — G43711 Chronic migraine without aura, intractable, with status migrainosus: Secondary | ICD-10-CM | POA: Insufficient documentation

## 2019-07-19 DIAGNOSIS — G43709 Chronic migraine without aura, not intractable, without status migrainosus: Secondary | ICD-10-CM | POA: Insufficient documentation

## 2019-07-19 MED ORDER — AJOVY 225 MG/1.5ML ~~LOC~~ SOAJ: 225.0000 mg | SUBCUTANEOUS | 11 refills | Status: DC

## 2019-07-19 MED ORDER — NURTEC 75 MG PO TBDP: 75.0000 mg | ORAL_TABLET | Freq: Every day | ORAL | 6 refills | Status: DC | PRN

## 2019-07-23 ENCOUNTER — Ambulatory Visit: Payer: No Typology Code available for payment source

## 2019-07-23 ENCOUNTER — Other Ambulatory Visit: Payer: Self-pay

## 2019-07-23 DIAGNOSIS — R519 Headache, unspecified: Secondary | ICD-10-CM | POA: Diagnosis not present

## 2019-07-23 DIAGNOSIS — H538 Other visual disturbances: Secondary | ICD-10-CM | POA: Diagnosis not present

## 2019-07-23 DIAGNOSIS — R51 Headache with orthostatic component, not elsewhere classified: Secondary | ICD-10-CM

## 2019-07-23 MED ORDER — GADOBENATE DIMEGLUMINE 529 MG/ML IV SOLN
10.0000 mL | Freq: Once | INTRAVENOUS | Status: AC | PRN
  Administered 2019-07-23: 10 mL via INTRAVENOUS

## 2019-07-25 ENCOUNTER — Telehealth: Payer: Self-pay | Admitting: *Deleted

## 2019-07-25 NOTE — Telephone Encounter (Signed)
Nurtec PA completed on CMM. Key: [id number]. Awaiting [employer] determination.

## 2019-07-25 NOTE — Telephone Encounter (Signed)
Completed Ajovy PA on CMM. Key: [id number]. Awaiting determination from [employer].

## 2019-08-01 ENCOUNTER — Encounter: Payer: Self-pay | Admitting: *Deleted

## 2019-08-01 NOTE — Telephone Encounter (Signed)
Per [employer], Ajovy denied: "For the prevention of chronic migraines, our guideline named [facility] (generic name for Ajovy) requires that the following rules be met: 1) You have had a previous trial of Aimovig AND Emgality, both of which require prior authorization. Your request was denied because we did not receive documentation that you have met these Rules."  PA # [id number]  Patient can use savings card. I updated her via mychart. If we should choose to appeal, fax within 180 days to [phone].

## 2019-08-01 NOTE — Telephone Encounter (Signed)
Per Cover My Meds, Nurtec approved.   The request has been approved. The authorization is effective for a maximum of 6 fills from 07/22/2019 to 01/21/2020, as long as the member is enrolled in their current health plan. The request was approved as submitted. This request is approved for 16 tablets per 30 days. Renewal requires that the request is for acute treatment of migraines and the patient has experienced an improvement from baseline in a validated acute treatment patient-reported outcome questionnaire (e.g., Migraine Assessment of Current Therapy [Migraine-ACT]) OR the patient has experienced clinical improvement as defined by one of the following: 1) ability to function normally within 2 hours of dose, 2) headache pain disappears within 2 hours of dose, or 3) therapy works consistently in majority of migraine attacks. A written notification letter will follow with additional details.   Sent pt mychart message with update.

## 2019-08-14 MED FILL — LOTEPREDNOL ETABONATE 0.5 %: 0.5 | 25 days supply | Qty: 5 | Fill #0

## 2019-08-14 MED FILL — RESTASIS 0.05% EYE EMULSION: 0.05 | 90 days supply | Qty: 180 | Fill #0

## 2019-08-14 MED FILL — ETONOGESTREL-ETHINYL ESTRAD: 0.12-0.015 | 28 days supply | Qty: 1 | Fill #1

## 2019-11-13 NOTE — Telephone Encounter (Signed)
Received fax from [facility] asking for prescription clarification for Ajovy, no specific aspects requested. I printed copy of the E-scribed Rx from 07/19/19 and faxed back to [facility] pharmacy. Received a receipt of confirmation.

## 2019-11-19 MED FILL — VIIBRYD 10 MG TABLET: 10 | 30 days supply | Qty: 30 | Fill #0

## 2020-01-09 ENCOUNTER — Encounter: Payer: Self-pay | Admitting: *Deleted

## 2020-02-27 NOTE — Telephone Encounter (Signed)
Called pt, no answer. Unable to leave detailed VM due to no DPR on file. On message I asked for pt to check mychart.

## 2020-07-12 ENCOUNTER — Encounter (HOSPITAL_COMMUNITY): Payer: Self-pay | Admitting: Emergency Medicine

## 2020-07-12 ENCOUNTER — Emergency Department (HOSPITAL_COMMUNITY): Payer: No Typology Code available for payment source

## 2020-07-12 ENCOUNTER — Emergency Department (HOSPITAL_COMMUNITY)
Admission: EM | Admit: 2020-07-12 | Discharge: 2020-07-12 | Disposition: A | Discharge status: Home / Self Care | Payer: No Typology Code available for payment source | Attending: Emergency Medicine | Admitting: Emergency Medicine

## 2020-07-12 ENCOUNTER — Other Ambulatory Visit: Payer: Self-pay

## 2020-07-12 DIAGNOSIS — R42 Dizziness and giddiness: Secondary | ICD-10-CM | POA: Insufficient documentation

## 2020-07-12 DIAGNOSIS — H53149 Visual discomfort, unspecified: Secondary | ICD-10-CM | POA: Diagnosis not present

## 2020-07-12 DIAGNOSIS — J029 Acute pharyngitis, unspecified: Secondary | ICD-10-CM | POA: Diagnosis not present

## 2020-07-12 DIAGNOSIS — R519 Headache, unspecified: Secondary | ICD-10-CM | POA: Insufficient documentation

## 2020-07-12 DIAGNOSIS — M542 Cervicalgia: Secondary | ICD-10-CM

## 2020-07-12 DIAGNOSIS — H9209 Otalgia, unspecified ear: Secondary | ICD-10-CM | POA: Insufficient documentation

## 2020-07-12 DIAGNOSIS — R110 Nausea: Secondary | ICD-10-CM | POA: Insufficient documentation

## 2020-07-12 HISTORY — DX: Migraine, unspecified, not intractable, without status migrainosus: G43.909

## 2020-07-12 HISTORY — DX: Depression, unspecified: F32.A

## 2020-07-12 LAB — BASIC METABOLIC PANEL
Anion gap: 4 — ABNORMAL LOW (ref 5–15)
BUN: 10 mg/dL (ref 6–20)
CO2: 24 mmol/L (ref 22–32)
Calcium: 9 mg/dL (ref 8.9–10.3)
Chloride: 106 mmol/L (ref 98–111)
Creatinine, Ser: 0.62 mg/dL (ref 0.44–1.00)
GFR, Estimated: >60 mL/min (ref >60)
Glucose, Bld: 94 mg/dL (ref 70–99)
Potassium: 3.9 mmol/L (ref 3.5–5.1)
Sodium: 134 mmol/L — ABNORMAL LOW (ref 135–145)

## 2020-07-12 LAB — CBC WITH DIFFERENTIAL/PLATELET
Abs Immature Granulocytes: 0.01 10*3/uL (ref 0.00–0.07)
Basophils Absolute: 0 10*3/uL (ref 0.0–0.1)
Basophils Relative: 1 %
Eosinophils Absolute: 0.1 10*3/uL (ref 0.0–0.5)
Eosinophils Relative: 2 %
HCT: 44.4 % (ref 36.0–46.0)
Hemoglobin: 15.3 g/dL — ABNORMAL HIGH (ref 12.0–15.0)
Immature Granulocytes: 0 %
Lymphocytes Relative: 22 %
Lymphs Abs: 1.8 10*3/uL (ref 0.7–4.0)
MCH: 31.9 pg (ref 26.0–34.0)
MCHC: 34.5 g/dL (ref 30.0–36.0)
MCV: 92.7 fL (ref 80.0–100.0)
Monocytes Absolute: 0.4 10*3/uL (ref 0.1–1.0)
Monocytes Relative: 5 %
Neutro Abs: 5.7 10*3/uL (ref 1.7–7.7)
Neutrophils Relative %: 70 %
Platelets: 227 10*3/uL (ref 150–400)
RBC: 4.79 MIL/uL (ref 3.87–5.11)
RDW: 12 % (ref 11.5–15.5)
WBC: 8.1 10*3/uL (ref 4.0–10.5)
nRBC: 0 % (ref 0.0–0.2)

## 2020-07-12 LAB — URINALYSIS, ROUTINE W REFLEX MICROSCOPIC
Bacteria, UA: NONE SEEN
Bilirubin Urine: NEGATIVE
Glucose, UA: NEGATIVE mg/dL
Ketones, ur: NEGATIVE mg/dL
Leukocytes,Ua: NEGATIVE
Nitrite: NEGATIVE
Protein, ur: NEGATIVE mg/dL
Specific Gravity, Urine: 1.003 — ABNORMAL LOW (ref 1.005–1.030)
pH: 6 (ref 5.0–8.0)

## 2020-07-12 LAB — PREGNANCY, URINE: Preg Test, Ur: NEGATIVE

## 2020-07-12 MED ORDER — IOHEXOL 350 MG/ML SOLN
80.0000 mL | Freq: Once | INTRAVENOUS | Status: AC | PRN
  Administered 2020-07-12: 80 mL via INTRAVENOUS

## 2020-07-12 NOTE — ED Notes (Signed)
Called lab and asked about the results for the BMP. Lab stated they will run them.

## 2020-07-12 NOTE — ED Provider Notes (Signed)
[facility] EMERGENCY DEPARTMENT Provider Note   CSN: [id number] Arrival date & time: 07/12/20  1657     History Chief Complaint  Patient presents with  . Headache    Diane Bautista is a 25 y.o. female.  She has a history of migraines.  She is complaining of a different kind of headache that is been bothering her for over a week.  She said this is more tension bandlike at her temples and then wraps around her head.  For the last few days it has gone down into her neck and her throat.  Using Tylenol without any relief.  Has some photophobia.  Feels a little dizzy lightheaded.  No focal numbness or weakness no blurry vision double vision.  The history is provided by the patient.  Headache Pain location:  Frontal, L temporal and R temporal Quality:  Dull Radiates to:  L neck and R neck Severity currently:  4/10 Onset quality:  Gradual Duration:  1 week Timing:  Constant Progression:  Worsening Chronicity:  New Similar to prior headaches: no   Relieved by:  Nothing Worsened by:  Light Ineffective treatments:  NSAIDs Associated symptoms: dizziness, ear pain, nausea, neck pain, photophobia and sore throat   Associated symptoms: no abdominal pain, no blurred vision, no cough, no fever and no focal weakness        Past Medical History:  Diagnosis Date  . Depression   . Migraine     Patient Active Problem List   Diagnosis Date Noted  . Chronic migraine without aura without status migrainosus, not intractable 07/19/2019  . Anxiety and depression 07/19/2019  . Chronic migraine without aura, with intractable migraine, so stated, with status migrainosus 07/19/2019    Past Surgical History:  Procedure Laterality Date  . WISDOM TOOTH EXTRACTION       OB History    Gravida  0   Para  0   Term  0   Preterm  0   AB  0   Living  0     SAB  0   IAB  0   Ectopic  0   Multiple  0   Live Births  0           Family History  Problem Relation Age of Onset  .  Migraines Mother   . Other Mother        borderline hypertension  . High Cholesterol Father   . Colon cancer Maternal Grandmother   . Diabetes Paternal Grandmother   . Depression Other     Social History   Tobacco Use  . Smoking status: Never Smoker  . Smokeless tobacco: Never Used  Vaping Use  . Vaping Use: Never used  Substance Use Topics  . Alcohol use: Yes    Alcohol/week: 4.0 standard drinks    Types: 4 Glasses of wine per week  . Drug use: No    Home Medications Prior to Admission medications   Medication Sig Start Date End Date Taking? Authorizing Provider  dimenhyDRINATE (DRAMAMINE PO) Take by mouth as needed.    [provider]  etonogestrel-ethinyl estradiol (NUVARING) 0.12-0.015 MG/24HR vaginal ring Place 1 each vaginally every 28 (twenty-eight) days. Insert vaginally and leave in place for 3 consecutive weeks, then remove for 1 week.    [provider]  Fremanezumab-vfrm (AJOVY) 225 MG/1.5ML SOAJ Inject 225 mg into the skin every 30 (thirty) days. 07/19/19   Anson Fret, MD  Rimegepant Sulfate (NURTEC) 75 MG TBDP  Take 75 mg by mouth daily as needed. For migraines. Take as close to onset of migraine as possible. One daily maximum. 07/19/19   Anson FretAhern, Antonia B, MD    Allergies    Patient has no known allergies.  Review of Systems   Review of Systems  Constitutional: Negative for fever.  HENT: Positive for ear pain and sore throat.   Eyes: Positive for photophobia. Negative for blurred vision and visual disturbance.  Respiratory: Negative for cough and shortness of breath.   Cardiovascular: Negative for chest pain.  Gastrointestinal: Positive for nausea. Negative for abdominal pain.  Genitourinary: Negative for dysuria.  Musculoskeletal: Positive for neck pain.  Skin: Negative for rash.  Neurological: Positive for dizziness and headaches. Negative for focal weakness.    Physical Exam Updated Vital Signs BP 115/63   Pulse 100   Temp (!)  97.5 F (36.4 C) (Oral)   Resp 18   Ht 5\' 2"  (1.575 m)   Wt 58.1 kg   LMP 06/25/2020   SpO2 99%   BMI 23.41 kg/m   Physical Exam Vitals and nursing note reviewed.  Constitutional:      General: She is not in acute distress.    Appearance: Normal appearance. She is well-developed and well-nourished.  HENT:     Head: Normocephalic and atraumatic.     Right Ear: Tympanic membrane normal.     Left Ear: Tympanic membrane normal.     Mouth/Throat:     Mouth: Mucous membranes are moist.     Pharynx: Oropharynx is clear.  Eyes:     Extraocular Movements: Extraocular movements intact.     Conjunctiva/sclera: Conjunctivae normal.     Pupils: Pupils are equal, round, and reactive to light.  Cardiovascular:     Rate and Rhythm: Normal rate and regular rhythm.     Heart sounds: No murmur heard.   Pulmonary:     Effort: Pulmonary effort is normal. No respiratory distress.     Breath sounds: Normal breath sounds.  Abdominal:     Palpations: Abdomen is soft.     Tenderness: There is no abdominal tenderness.  Musculoskeletal:        General: No edema.     Cervical back: Neck supple.  Skin:    General: Skin is warm and dry.  Neurological:     General: No focal deficit present.     Mental Status: She is alert.     GCS: GCS eye subscore is 4. GCS verbal subscore is 5. GCS motor subscore is 6.     Cranial Nerves: No dysarthria or facial asymmetry.     Gait: Gait normal.  Psychiatric:        Mood and Affect: Mood and affect normal.     ED Results / Procedures / Treatments   Labs (all labs ordered are listed, but only abnormal results are displayed) Labs Reviewed  CBC WITH DIFFERENTIAL/PLATELET - Abnormal; Notable for the following components:      Result Value   Hemoglobin 15.3 (*)    All other components within normal limits  URINALYSIS, ROUTINE W REFLEX MICROSCOPIC - Abnormal; Notable for the following components:   Color, Urine CLEAR AND COLORLESS (*)    Specific Gravity,  Urine 1.003 (*)    Hgb urine dipstick SMALL (*)    All other components within normal limits  BASIC METABOLIC PANEL - Abnormal; Notable for the following components:   Sodium 134 (*)    Anion gap 4 (*)  All other components within normal limits  PREGNANCY, URINE    EKG None  Radiology CT Angio Head W/Cm &/Or Wo Cm  Result Date: 07/12/2020 CLINICAL DATA:  Neck pain, throat pain and headache. EXAM: CT ANGIOGRAPHY HEAD AND NECK TECHNIQUE: Multidetector CT imaging of the head and neck was performed using the standard protocol during bolus administration of intravenous contrast. Multiplanar CT image reconstructions and MIPs were obtained to evaluate the vascular anatomy. Carotid stenosis measurements (when applicable) are obtained utilizing NASCET criteria, using the distal internal carotid diameter as the denominator. CONTRAST:  21mL OMNIPAQUE IOHEXOL 350 MG/ML SOLN COMPARISON:  MRI 07/23/2019 FINDINGS: CTA NECK FINDINGS Aortic arch: Normal Right carotid system: Normal. No dissection or atherosclerotic disease. Left carotid system: Normal. No dissection or atherosclerotic disease. Vertebral arteries: Both vertebral artery origins are normal. Both vertebral arteries are normal through the cervical region to the foramen magnum. No evidence of dissection. Skeleton: Normal Other neck: No mass or lymphadenopathy. Upper chest: Normal Review of the MIP images confirms the above findings CTA HEAD FINDINGS Anterior circulation: Both internal carotid arteries are patent through the skull base and siphon regions. Anterior and middle cerebral vessels are normal. No sign of anterior circulation intracranial aneurysm, vascular malformation or branch vessel occlusion. Posterior circulation: Both vertebral arteries widely patent to the basilar. No basilar stenosis. Posterior circulation branch vessels are normal. Venous sinuses: Patent and normal. Anatomic variants: None significant. Brain parenchyma appears normal  without evidence of malformation, old or acute infarction, mass lesion, hemorrhage, hydrocephalus or extra-axial collection sinuses clear. Orbits appear normal. Review of the MIP images confirms the above findings IMPRESSION: Normal CT angiography of the neck and head. No sign of dissection or other vascular pathology. Electronically Signed   By: Paulina Fusi M.D.   On: 07/12/2020 20:19   CT Angio Neck W and/or Wo Contrast  Result Date: 07/12/2020 CLINICAL DATA:  Neck pain, throat pain and headache. EXAM: CT ANGIOGRAPHY HEAD AND NECK TECHNIQUE: Multidetector CT imaging of the head and neck was performed using the standard protocol during bolus administration of intravenous contrast. Multiplanar CT image reconstructions and MIPs were obtained to evaluate the vascular anatomy. Carotid stenosis measurements (when applicable) are obtained utilizing NASCET criteria, using the distal internal carotid diameter as the denominator. CONTRAST:  55mL OMNIPAQUE IOHEXOL 350 MG/ML SOLN COMPARISON:  MRI 07/23/2019 FINDINGS: CTA NECK FINDINGS Aortic arch: Normal Right carotid system: Normal. No dissection or atherosclerotic disease. Left carotid system: Normal. No dissection or atherosclerotic disease. Vertebral arteries: Both vertebral artery origins are normal. Both vertebral arteries are normal through the cervical region to the foramen magnum. No evidence of dissection. Skeleton: Normal Other neck: No mass or lymphadenopathy. Upper chest: Normal Review of the MIP images confirms the above findings CTA HEAD FINDINGS Anterior circulation: Both internal carotid arteries are patent through the skull base and siphon regions. Anterior and middle cerebral vessels are normal. No sign of anterior circulation intracranial aneurysm, vascular malformation or branch vessel occlusion. Posterior circulation: Both vertebral arteries widely patent to the basilar. No basilar stenosis. Posterior circulation branch vessels are normal. Venous  sinuses: Patent and normal. Anatomic variants: None significant. Brain parenchyma appears normal without evidence of malformation, old or acute infarction, mass lesion, hemorrhage, hydrocephalus or extra-axial collection sinuses clear. Orbits appear normal. Review of the MIP images confirms the above findings IMPRESSION: Normal CT angiography of the neck and head. No sign of dissection or other vascular pathology. Electronically Signed   By: Paulina Fusi M.D.   On:  07/12/2020 20:19    Procedures Procedures   Medications Ordered in ED Medications - No data to display  ED Course  I have reviewed the triage vital signs and the nursing notes.  Pertinent labs & imaging results that were available during my care of the patient were reviewed by me and considered in my medical decision making (see chart for details).    MDM Rules/Calculators/A&P                         This patient complains of headache radiating to her neck and throat; this involves an extensive number of treatment Options and is a complaint that carries with it a high risk of complications and Morbidity. The differential includes tension headache, migraine, subarachnoid, tumor  I ordered, reviewed and interpreted labs, which included CBC with white count normal hemoglobin, chemistries normal, analysis negative, pregnancy test negative  I ordered imaging studies which included CTA head and neck and I independently    visualized and interpreted imaging which showed no acute findings Previous records obtained and reviewed in epic no recent admissions  After the interventions stated above, I reevaluated the patient and found patient to be in no distress. Reviewed work-up with her and she is comfortable plan for symptomatic treatment and outpatient follow-up with her primary care doctor. Return instructions discussed.   Final Clinical Impression(s) / ED Diagnoses Final diagnoses:  Generalized headache  Neck pain    Rx / DC  Orders ED Discharge Orders    None       Terrilee Files, MD 07/13/20 1002

## 2020-07-12 NOTE — ED Triage Notes (Addendum)
Patient c/o headache x 3 days. Patient reports lightheadedness, blurred vision, and sensitivity to light. Denies any nausea or vomiting. Per patient hx of migraine headaches. Denies any injury to head, taking anticoagulants, or hx of brain aneurysm. Patient taking ibuprofen with no relief-last dose x1 hour prior to arriving to ED.

## 2020-07-12 NOTE — Discharge Instructions (Addendum)
You were seen in the emergency department for 1 week of headache radiating into your neck.  You had blood work and a CAT scan of your head and neck that did not show any significant abnormalities.  Please continue to use Tylenol and ibuprofen as needed and get plenty of rest.  Follow-up with your doctor.  Return to the emergency department for any worsening or concerning symptoms

## 2020-07-23 ENCOUNTER — Emergency Department (HOSPITAL_COMMUNITY)
Admission: EM | Admit: 2020-07-23 | Discharge: 2020-07-23 | Disposition: A | Discharge status: Home / Self Care | Payer: No Typology Code available for payment source | Attending: Emergency Medicine | Admitting: Emergency Medicine

## 2020-07-23 ENCOUNTER — Emergency Department (HOSPITAL_COMMUNITY): Payer: No Typology Code available for payment source

## 2020-07-23 ENCOUNTER — Encounter (HOSPITAL_COMMUNITY): Payer: Self-pay | Admitting: Emergency Medicine

## 2020-07-23 ENCOUNTER — Encounter: Payer: Self-pay | Admitting: Emergency Medicine

## 2020-07-23 ENCOUNTER — Ambulatory Visit: Admission: EM | Admit: 2020-07-23 | Discharge: 2020-07-23 | Payer: No Typology Code available for payment source

## 2020-07-23 ENCOUNTER — Other Ambulatory Visit: Payer: Self-pay

## 2020-07-23 DIAGNOSIS — K219 Gastro-esophageal reflux disease without esophagitis: Secondary | ICD-10-CM | POA: Insufficient documentation

## 2020-07-23 DIAGNOSIS — R079 Chest pain, unspecified: Secondary | ICD-10-CM | POA: Diagnosis present

## 2020-07-23 DIAGNOSIS — F419 Anxiety disorder, unspecified: Secondary | ICD-10-CM | POA: Insufficient documentation

## 2020-07-23 HISTORY — DX: Anxiety disorder, unspecified: F41.9

## 2020-07-23 LAB — CBC WITH DIFFERENTIAL/PLATELET
Abs Immature Granulocytes: 0.01 10*3/uL (ref 0.00–0.07)
Basophils Absolute: 0 10*3/uL (ref 0.0–0.1)
Basophils Relative: 0 %
Eosinophils Absolute: 0 10*3/uL (ref 0.0–0.5)
Eosinophils Relative: 0 %
HCT: 41.9 % (ref 36.0–46.0)
Hemoglobin: 14.4 g/dL (ref 12.0–15.0)
Immature Granulocytes: 0 %
Lymphocytes Relative: 16 %
Lymphs Abs: 1.2 10*3/uL (ref 0.7–4.0)
MCH: 32 pg (ref 26.0–34.0)
MCHC: 34.4 g/dL (ref 30.0–36.0)
MCV: 93.1 fL (ref 80.0–100.0)
Monocytes Absolute: 0.2 10*3/uL (ref 0.1–1.0)
Monocytes Relative: 3 %
Neutro Abs: 6 10*3/uL (ref 1.7–7.7)
Neutrophils Relative %: 81 %
Platelets: 198 10*3/uL (ref 150–400)
RBC: 4.5 MIL/uL (ref 3.87–5.11)
RDW: 12.9 % (ref 11.5–15.5)
WBC: 7.4 10*3/uL (ref 4.0–10.5)
nRBC: 0 % (ref 0.0–0.2)

## 2020-07-23 LAB — COMPREHENSIVE METABOLIC PANEL
ALT: 25 U/L (ref 0–44)
AST: 17 U/L (ref 15–41)
Albumin: 3.7 g/dL (ref 3.5–5.0)
Alkaline Phosphatase: 61 U/L (ref 38–126)
Anion gap: 9 (ref 5–15)
BUN: 12 mg/dL (ref 6–20)
CO2: 23 mmol/L (ref 22–32)
Calcium: 8.9 mg/dL (ref 8.9–10.3)
Chloride: 105 mmol/L (ref 98–111)
Creatinine, Ser: 0.63 mg/dL (ref 0.44–1.00)
GFR, Estimated: >60 mL/min (ref >60)
Glucose, Bld: 107 mg/dL — ABNORMAL HIGH (ref 70–99)
Potassium: 3.3 mmol/L — ABNORMAL LOW (ref 3.5–5.1)
Sodium: 137 mmol/L (ref 135–145)
Total Bilirubin: 0.6 mg/dL (ref 0.3–1.2)
Total Protein: 7.1 g/dL (ref 6.5–8.1)

## 2020-07-23 LAB — TROPONIN I (HIGH SENSITIVITY): Troponin I (High Sensitivity): <2 ng/L (ref <18)

## 2020-07-23 LAB — LIPASE, BLOOD: Lipase: 33 U/L (ref 11–51)

## 2020-07-23 MED ORDER — ALUM & MAG HYDROXIDE-SIMETH 200-200-20 MG/5ML PO SUSP
30.0000 mL | Freq: Once | ORAL | Status: AC
  Administered 2020-07-23: 30 mL via ORAL
  Filled 2020-07-23: qty 30

## 2020-07-23 MED ORDER — HYDROXYZINE HCL 25 MG PO TABS: 25.0000 mg | ORAL_TABLET | Freq: Four times a day (QID) | ORAL | 0 refills | Status: AC

## 2020-07-23 MED ORDER — FAMOTIDINE IN NACL 20-0.9 MG/50ML-% IV SOLN
20.0000 mg | Freq: Once | INTRAVENOUS | Status: AC
  Administered 2020-07-23: 20 mg via INTRAVENOUS
  Filled 2020-07-23: qty 50

## 2020-07-23 MED ORDER — LIDOCAINE VISCOUS HCL 2 % MT SOLN
15.0000 mL | Freq: Once | OROMUCOSAL | Status: DC
  Filled 2020-07-23: qty 15

## 2020-07-23 NOTE — ED Notes (Signed)
After speaking with provider, patient is going to the emergency room for treatment

## 2020-07-23 NOTE — ED Triage Notes (Signed)
States she had an anxiety attack this morning and the right side of her neck hurts.  States this happens sometimes when she has an anxiety attack.

## 2020-07-23 NOTE — ED Triage Notes (Signed)
Patient c/o intermittent heartburn x2 weeks. Per patient worse due to anxiety. Per patient nausea at times with eating. Denies any shortness of breath, dizziness, weakness, or back pain. Denies any cardiac hx.

## 2020-07-23 NOTE — ED Provider Notes (Signed)
[facility] EMERGENCY DEPARTMENT Provider Note   CSN: [id number] Arrival date & time: 07/23/20  0913     History Chief Complaint  Patient presents with  . Heartburn    Diane Bautista is a 25 y.o. female with past medical history of anxiety, GERD, depression that presents the emergency department today for evaluation of chest burning sensation.  Patient states that for the past 2 weeks she has had a burning sensation in her chest which normally triggers her anxiety causing chest pain and some nausea.  Patient denies any abdominal pain, fevers, chills, cough.  Patient states that she went to her PCP who put her on a PPI, states that she is been taking this for 2 days without any relief.  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  Denies any sick contacts, no cardiac history.  States that she is generally healthy.  No alcohol or substance abuse.  Denies any regurgitation, burning sensation comes and goes, currently did have some this morning. Is intermittent.  Does not take anything for her anxiety.  Chest burning sensation does not radiate anywhere, not worse with exertion.  No triggering events.  No other complaints at this time.  No fevers, chills vomiting, bloody stools.  HPI     Past Medical History:  Diagnosis Date  . Anxiety   . Depression   . Migraine     Patient Active Problem List   Diagnosis Date Noted  . Chronic migraine without aura without status migrainosus, not intractable 07/19/2019  . Anxiety and depression 07/19/2019  . Chronic migraine without aura, with intractable migraine, so stated, with status migrainosus 07/19/2019    Past Surgical History:  Procedure Laterality Date  . WISDOM TOOTH EXTRACTION       OB History    Gravida  0   Para  0   Term  0   Preterm  0   AB  0   Living  0     SAB  0   IAB  0   Ectopic  0   Multiple  0   Live Births  0            Family History  Problem Relation Age of Onset  . Migraines Mother   . Other Mother        borderline hypertension  . High Cholesterol Father   . Colon cancer Maternal Grandmother   . Diabetes Paternal Grandmother   . Depression Other     Social History   Tobacco Use  . Smoking status: Never Smoker  . Smokeless tobacco: Never Used  Vaping Use  . Vaping Use: Never used  Substance Use Topics  . Alcohol use: Yes    Alcohol/week: 4.0 standard drinks    Types: 4 Glasses of wine per week  . Drug use: No    Home Medications Prior to Admission medications   Medication Sig Start Date End Date Taking? Authorizing Provider  etonogestrel-ethinyl estradiol (NUVARING) 0.12-0.015 MG/24HR vaginal ring Place 1 each vaginally every 28 (twenty-eight) days. Insert vaginally and leave in place for 3 consecutive weeks, then remove for 1 week.   Yes [provider]  hydrOXYzine (ATARAX/VISTARIL) 25 MG tablet Take 1 tablet (25 mg total) by mouth every 6 (six) hours. 07/23/20  Yes Farrel Gordon, PA-C  pantoprazole (PROTONIX) 40 MG tablet Take 40 mg by mouth daily. 07/21/20  Yes [provider]  Fremanezumab-vfrm (AJOVY) 225 MG/1.5ML SOAJ Inject 225 mg into the skin every 30 (thirty) days. Patient not taking: Reported on 07/23/2020 07/19/19   Anson Fret, MD  Rimegepant Sulfate (NURTEC) 75 MG TBDP Take 75 mg by mouth daily as needed. For migraines. Take as close to onset of migraine as possible. One daily maximum. Patient not taking: Reported on 07/23/2020 07/19/19   Anson Fret, MD    Allergies    Patient has no known allergies.  Review of Systems   Review of Systems  Constitutional: Negative for chills, diaphoresis, fatigue and fever.  HENT: Negative for congestion, sore throat and trouble swallowing.   Eyes: Negative for pain and visual disturbance.  Respiratory: Negative for cough, shortness of breath and wheezing.   Cardiovascular: Positive for chest pain. Negative  for palpitations and leg swelling.  Gastrointestinal: Negative for abdominal distention, abdominal pain, diarrhea, nausea and vomiting.  Genitourinary: Negative for difficulty urinating.  Musculoskeletal: Negative for back pain, neck pain and neck stiffness.  Skin: Negative for pallor.  Neurological: Negative for dizziness, speech difficulty, weakness and headaches.  Psychiatric/Behavioral: Negative for confusion.    Physical Exam Updated Vital Signs BP 106/72 (BP Location: Left Arm)   Pulse 99   Temp 98.2 F (36.8 C) (Oral)   Resp 20   Ht 5\' 2"  (1.575 m)   Wt 59 kg   LMP 06/25/2020   SpO2 100%   BMI 23.78 kg/m   Physical Exam Constitutional:      General: She is not in acute distress.    Appearance: Normal appearance. She is not ill-appearing, toxic-appearing or diaphoretic.  HENT:     Mouth/Throat:     Mouth: Mucous membranes are moist.     Pharynx: Oropharynx is clear.  Eyes:     General: No scleral icterus.    Extraocular Movements: Extraocular movements intact.     Pupils: Pupils are equal, round, and reactive to light.  Cardiovascular:     Rate and Rhythm: Normal rate and regular rhythm.     Pulses: Normal pulses.     Heart sounds: Normal heart sounds.  Pulmonary:     Effort: Pulmonary effort is normal. No respiratory distress.     Breath sounds: Normal breath sounds. No stridor. No wheezing, rhonchi or rales.  Chest:     Chest wall: No tenderness.  Abdominal:     General: Abdomen is flat. There is no distension.     Palpations: Abdomen is soft.     Tenderness: There is no abdominal tenderness. There is no guarding or rebound.  Musculoskeletal:        General: No swelling or tenderness. Normal range of motion.     Cervical back: Normal range of motion and neck supple. No rigidity.     Right lower leg: No edema.     Left lower leg: No edema.  Skin:    General: Skin is warm and dry.     Capillary Refill: Capillary refill takes less than 2 seconds.      Coloration: Skin is not pale.  Neurological:     General: No focal deficit present.     Mental Status: She is alert and oriented to person, place, and time.  Psychiatric:        Mood and Affect: Mood is anxious.        Behavior: Behavior normal.     ED Results / Procedures / Treatments   Labs (all labs ordered are listed, but only abnormal  results are displayed) Labs Reviewed  COMPREHENSIVE METABOLIC PANEL - Abnormal; Notable for the following components:      Result Value   Potassium 3.3 (*)    Glucose, Bld 107 (*)    All other components within normal limits  CBC WITH DIFFERENTIAL/PLATELET  LIPASE, BLOOD  TROPONIN I (HIGH SENSITIVITY)    EKG EKG Interpretation  Date/Time:  Thursday July 23 2020 10:28:22 EST Ventricular Rate:  88 PR Interval:    QRS Duration: 72 QT Interval:  353 QTC Calculation: 428 R Axis:   -2 Text Interpretation: Sinus rhythm RSR' in V1 or V2, right VCD or RVH No previous ECGs available Confirmed by Vanetta MuldersZackowski, Scott [phone]([id number]) on 07/23/2020 12:24:24 PM   Radiology DG Chest Port 1 View  Result Date: 07/23/2020 CLINICAL DATA:  Chest pain for 2 weeks. EXAM: PORTABLE CHEST 1 VIEW COMPARISON:  None. FINDINGS: Lungs clear. Heart size normal. No pneumothorax or pleural fluid. No bony abnormality. IMPRESSION: Normal chest. Electronically Signed   By: Drusilla Kannerhomas  Dalessio M.D.   On: 07/23/2020 12:04    Procedures Procedures   Medications Ordered in ED Medications  alum & mag hydroxide-simeth (MAALOX/MYLANTA) 200-200-20 MG/5ML suspension 30 mL (30 mLs Oral Given 07/23/20 1153)    And  lidocaine (XYLOCAINE) 2 % viscous mouth solution 15 mL (15 mLs Oral Not Given 07/23/20 1153)  famotidine (PEPCID) IVPB 20 mg premix (0 mg Intravenous Stopped 07/23/20 1222)    ED Course  I have reviewed the triage vital signs and the nursing notes.  Pertinent labs & imaging results that were available during my care of the patient were reviewed by me and considered in my  medical decision making (see chart for details).    MDM Rules/Calculators/A&P                          Blonnie Leroy KennedyF Bogacz is a 25 y.o. female with past medical history of anxiety, GERD, depression that presents the emerge department today for evaluation of chest burning sensation.  Patient most likely does have acid reflux, PPI has only been initiated for 2 days.  Acid reflux is causing patient to have anxiety, patient does appear as an anxious person.  No right upper quadrant tenderness.  Will obtain basic labs at this time and give GI cocktail and reevaluate.  Work-up today benign, CBC, CMP troponin less than 2.  Lipase 33.  Potassium 3.3, did offer patient some potassium pills, patient retracted.  Upon reevaluation patient states that she feels much better with GI cocktail and Pepcid, states that she is pain-free.  Most likely acid reflux, will also give patient small prescription of Atarax for patient's anxiety.  Patient follow-up with PCP.  Doubt need for further emergent work up at this time. I explained the diagnosis and have given explicit precautions to return to the ER including for any other new or worsening symptoms. The patient understands and accepts the medical plan as it's been dictated and I have answered their questions. Discharge instructions concerning home care and prescriptions have been given. The patient is STABLE and is discharged to home in good condition.    Final Clinical Impression(s) / ED Diagnoses Final diagnoses:  Gastroesophageal reflux disease, unspecified whether esophagitis present  Anxiety    Rx / DC Orders ED Discharge Orders         Ordered    hydrOXYzine (ATARAX/VISTARIL) 25 MG tablet  Every 6 hours        07/23/20 1300  Farrel Gordon, PA-C 07/23/20 1315    Vanetta Mulders, MD 08/04/20 [phone]

## 2020-07-23 NOTE — Discharge Instructions (Signed)
You are seen today for acid reflux, your work-up was unremarkable.  Please use the attached instructions for food choices for acid reflux, in regards to managing her anxiety I did provide you with a small prescription of Atarax as we spoke about.  You can also use the attached instructions for this.  Please follow-up with your PCP next couple of days.  Continue to take your PPI.  If you have any new worsening concerning symptoms he is back to the emergency department.   Please speak to your pharmacist today about any new medications or interactions that were prescribed today.  Get help right away if: You have sudden pain in your arms, neck, jaw, teeth, or back. You suddenly feel sweaty, dizzy, or light-headed. You have chest pain or shortness of breath. You vomit and the vomit is green, yellow, or black, or it looks like blood or coffee grounds. You faint. Your poop (stool) is red, bloody, or black. You cannot swallow, drink, or eat.

## 2020-07-23 NOTE — ED Triage Notes (Signed)
States she has been having heart burn x 2 weeks and anxiety.  States her chest will hurt and throat burns and feels like something is stuck in her throat.  States she has been having diarreaha and her stool is green in color x 5 days.

## 2020-08-17 ENCOUNTER — Other Ambulatory Visit (HOSPITAL_COMMUNITY): Payer: Self-pay

## 2020-08-17 MED ORDER — PANTOPRAZOLE SODIUM 40 MG PO TBEC
40.0000 mg | DELAYED_RELEASE_TABLET | Freq: Two times a day (BID) | ORAL | 4 refills | Status: DC
  Filled 2020-08-17: qty 60, 30d supply, fill #0
  Filled 2020-09-14: qty 180, 90d supply, fill #1
  Filled 2020-12-03: qty 60, 30d supply, fill #2

## 2020-08-20 ENCOUNTER — Other Ambulatory Visit (HOSPITAL_COMMUNITY): Payer: Self-pay

## 2020-08-20 MED ORDER — FAMOTIDINE 40 MG PO TABS
40.0000 mg | ORAL_TABLET | Freq: Two times a day (BID) | ORAL | 5 refills | Status: DC
  Filled 2020-08-20: qty 60, 30d supply, fill #0
  Filled 2020-12-03: qty 60, 30d supply, fill #1

## 2020-08-28 ENCOUNTER — Telehealth: Payer: No Typology Code available for payment source | Admitting: Emergency Medicine

## 2020-08-28 ENCOUNTER — Encounter: Payer: Self-pay | Admitting: Emergency Medicine

## 2020-08-28 DIAGNOSIS — L03221 Cellulitis of neck: Secondary | ICD-10-CM | POA: Diagnosis not present

## 2020-08-28 MED ORDER — DOXYCYCLINE HYCLATE 100 MG PO CAPS: 100.0000 mg | ORAL_CAPSULE | Freq: Two times a day (BID) | ORAL | 0 refills | Status: DC

## 2020-08-28 NOTE — Progress Notes (Signed)
Diane Bautista, Diane Bautista are scheduled for a virtual visit with your provider today.    Just as we do with appointments in the office, we must obtain your consent to participate.  Your consent will be active for this visit and any virtual visit you may have with one of our providers in the next 365 days.    If you have a MyChart account, I can also send a copy of this consent to you electronically.  All virtual visits are billed to your insurance company just like a traditional visit in the office.  As this is a virtual visit, video technology does not allow for your provider to perform a traditional examination.  This may limit your provider's ability to fully assess your condition.  If your provider identifies any concerns that need to be evaluated in person or the need to arrange testing such as labs, EKG, etc, we will make arrangements to do so.    Although advances in technology are sophisticated, we cannot ensure that it will always work on either your end or our end.  If the connection with a video visit is poor, we may have to switch to a telephone visit.  With either a video or telephone visit, we are not always able to ensure that we have a secure connection.   I need to obtain your verbal consent now.   Are you willing to proceed with your visit today?   Kiora OHANNA GASSERT has provided verbal consent on 08/28/2020 for a virtual visit (video or telephone).   Roxy Horseman, PA-C 08/28/2020  1:15 PM     Virtual Visit via Video   I connected with patient on 08/28/20 at  1:30 PM EDT by a video enabled telemedicine application and verified that I am speaking with the correct person using two identifiers.  Location patient: Home Location provider: [facility] Persons participating in the virtual visit: Patient, Provider  I discussed the limitations of evaluation and management by telemedicine and the availability of in person appointments. The patient expressed understanding and  agreed to proceed.  Subjective:   HPI:   Patient presents via telephone (cargility audio failed) today with chief complaint of redness on neck on the right anterior neck.  Woke up with it about 4 days ago. She states that it is not painful.  She denies any itching.  She states that it is not draining anything.  Denies any other associated symptoms.  Denies any arthralgias or myalgias.  States that she isn't sure what happened.  Denies actually seeing a tick or a spider, but expresses concern due to the circular rash around the site.  ROS:   See pertinent positives and negatives per HPI.  Patient Active Problem List   Diagnosis Date Noted  . Chronic migraine without aura without status migrainosus, not intractable 07/19/2019  . Anxiety and depression 07/19/2019  . Chronic migraine without aura, with intractable migraine, so stated, with status migrainosus 07/19/2019    Social History   Tobacco Use  . Smoking status: Never Smoker  . Smokeless tobacco: Never Used  Substance Use Topics  . Alcohol use: Yes    Alcohol/week: 4.0 standard drinks    Types: 4 Glasses of wine per week    Current Outpatient Medications:  .  etonogestrel-ethinyl estradiol (NUVARING) 0.12-0.015 MG/24HR vaginal ring, Place 1 each vaginally every 28 (twenty-eight) days. Insert vaginally and leave in place for 3 consecutive weeks, then remove for 1 week., Disp: , Rfl:  .  famotidine (PEPCID) 40 MG tablet, Take 1 tablet (40 mg total) by mouth 2 (two) times daily., Disp: 60 tablet, Rfl: 5 .  Fremanezumab-vfrm (AJOVY) 225 MG/1.5ML SOAJ, Inject 225 mg into the skin every 30 (thirty) days. (Patient not taking: Reported on 07/23/2020), Disp: 1 pen, Rfl: 11 .  hydrOXYzine (ATARAX/VISTARIL) 25 MG tablet, Take 1 tablet (25 mg total) by mouth every 6 (six) hours., Disp: 12 tablet, Rfl: 0 .  pantoprazole (PROTONIX) 40 MG tablet, Take 40 mg by mouth daily., Disp: , Rfl:  .  pantoprazole (PROTONIX) 40 MG tablet, Take 1 tablet  (40 mg total) by mouth 2 (two) times daily., Disp: 60 tablet, Rfl: 4 .  Rimegepant Sulfate (NURTEC) 75 MG TBDP, Take 75 mg by mouth daily as needed. For migraines. Take as close to onset of migraine as possible. One daily maximum. (Patient not taking: Reported on 07/23/2020), Disp: 10 tablet, Rfl: 6  No Known Allergies  Objective:   There were no vitals taken for this visit.  Telephone visit.  Video failed. No labored breathing.  Speech is clear and coherent with logical content.  Patient is alert and oriented at baseline.    Assessment and Plan:   1. Cellulitis - Questionable bite tick vs spider, but never seen. Will cover for cellulitis.  Doxy will cover tickborne illness as well, though this is thought less likely.  -Doxy 100mg  BID x 10 days.   , PA-C 08/28/2020

## 2020-09-04 ENCOUNTER — Telehealth: Payer: No Typology Code available for payment source | Admitting: Nurse Practitioner

## 2020-09-04 DIAGNOSIS — K219 Gastro-esophageal reflux disease without esophagitis: Secondary | ICD-10-CM

## 2020-09-04 NOTE — Progress Notes (Signed)
Based on what you shared with me it looks like you have heartburn,that should be evaluated in a face to face office visit. According to your chart you are currently onprotonix and pepcid. That is all I can ofer in an evisit. You will either need to see your PCP or contact them to do a referral to Gastointestinal physician for further treatment.    NOTE: If you entered your credit card information for this eVisit, you will not be charged. You may see a "hold" on your card for the $35 but that hold will drop off and you will not have a charge processed.  If you are having a true medical emergency please call 911.     For an urgent face to face visit, [facility] has four urgent care centers for your convenience:   . [facility]    [phone]                  Get Driving Directions  [address] [place], [place] [address] . 10 am to 8 pm Monday-Friday . 12 pm to 8 pm Saturday-Sunday   . [facility] at [facility]  [phone]                  Get Driving Directions  [address] [place], [place] [address] . 8 am to 8 pm Monday-Friday . 9 am to 6 pm Saturday . 11 am to 6 pm Sunday   . [facility] at [facility]  [phone]                  Get Driving Directions   [address] [place], [place] [address] . 8 am to 8 pm Monday-Friday . 8 am to 4 pm Saturday-Sunday    . [facility] at [facility] Directions  [phone]  [address] [place], [place] [address]  . Monday-Friday, 12 PM to 6 PM    Your e-visit answers were reviewed by a board certified advanced clinical practitioner to complete your personal care plan.  Thank you for using e-Visits.

## 2020-09-14 ENCOUNTER — Other Ambulatory Visit (HOSPITAL_COMMUNITY): Payer: Self-pay

## 2020-09-17 ENCOUNTER — Other Ambulatory Visit (HOSPITAL_COMMUNITY): Payer: Self-pay

## 2020-11-05 ENCOUNTER — Other Ambulatory Visit: Payer: Self-pay | Admitting: Sports Medicine

## 2020-11-05 ENCOUNTER — Ambulatory Visit (INDEPENDENT_AMBULATORY_CARE_PROVIDER_SITE_OTHER): Payer: No Typology Code available for payment source | Admitting: Sports Medicine

## 2020-11-05 ENCOUNTER — Other Ambulatory Visit: Payer: Self-pay

## 2020-11-05 ENCOUNTER — Ambulatory Visit (INDEPENDENT_AMBULATORY_CARE_PROVIDER_SITE_OTHER): Payer: No Typology Code available for payment source

## 2020-11-05 ENCOUNTER — Encounter: Payer: Self-pay | Admitting: Sports Medicine

## 2020-11-05 DIAGNOSIS — M79671 Pain in right foot: Secondary | ICD-10-CM

## 2020-11-05 DIAGNOSIS — G5791 Unspecified mononeuropathy of right lower limb: Secondary | ICD-10-CM

## 2020-11-05 DIAGNOSIS — M792 Neuralgia and neuritis, unspecified: Secondary | ICD-10-CM | POA: Diagnosis not present

## 2020-11-05 NOTE — Patient Instructions (Signed)
OTC Nervive supplement for your nerves   For tennis shoes recommend:  Anne Shutter Ascis New balance Saucony Can be purchased at [employer] sports or [employer] arch fit Can be purchased at any major retailers  Vionic  SAS Can be purchased at [employer] or [employer]   For work shoes recommend: [employer] Work [employer] Can be purchased at a variety of places or Scientist, product/process development   For casual shoes recommend: Vionic  Can be purchased at [employer] or [employer]   For Over the [employer]s recommend: Power Steps Can be purchased in office/[facility] [employer] Can be purchased at [employer] sports or [employer] Heel cups Airplus Can be purchased at [employer]

## 2020-11-05 NOTE — Progress Notes (Signed)
Subjective: Diane Bautista is a 25 y.o. female patient who presents to office for evaluation of numb sensation to the central right heel.  Patient reports that she has a numb spot that has gradually increased in size over the last 2 years states that it is not painful but she does notice that occasionally she will get some tingling and her fourth and fifth toes and sometimes the third toes will feel weird and go to sleep when pressure is applied patient reports that she only feels the pressure or the sensation with direct pressure to the area and does not recall if it happens with walking all of the time or with certain shoes.  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  Patient Active Problem List   Diagnosis Date Noted   Chronic migraine without aura without status migrainosus, not intractable 07/19/2019   Anxiety and depression 07/19/2019   Chronic migraine without aura, with intractable migraine, so stated, with status migrainosus 07/19/2019    Current Outpatient Medications on File Prior to Visit  Medication Sig Dispense Refill   etonogestrel-ethinyl estradiol (NUVARING) 0.12-0.015 MG/24HR vaginal ring Place 1 each vaginally every 28 (twenty-eight) days. Insert vaginally and leave in place for 3 consecutive weeks, then remove for 1 week.     famotidine (PEPCID) 40 MG tablet Take 1 tablet (40 mg total) by mouth 2 (two) times daily. 60 tablet 5   FLUoxetine (PROZAC) 10 MG capsule Take 10 mg by mouth daily.     hydrOXYzine (ATARAX/VISTARIL) 25 MG tablet Take 1 tablet (25 mg total) by mouth every 6 (six) hours. 12 tablet 0   pantoprazole (PROTONIX) 40 MG tablet Take 1 tablet (40 mg total) by mouth 2 (two) times daily. 60 tablet 4   Rimegepant Sulfate (NURTEC) 75 MG TBDP Take 75 mg by mouth daily as needed. For migraines. Take as close to  onset of migraine as possible. One daily maximum. 10 tablet 6   sucralfate (CARAFATE) 1 g tablet Take 1 g by mouth 2 (two) times daily.     No current facility-administered medications on file prior to visit.    No Known Allergies  Objective:  General: Alert and oriented x3 in no acute distress  Dermatology: No open lesions bilateral lower extremities, no webspace macerations, no ecchymosis bilateral, all nails x 10 are well manicured.  Vascular: Dorsalis Pedis and Posterior Tibial pedal pulses palpable, Capillary Fill Time 3 seconds,(+) pedal hair growth bilateral, no edema bilateral lower extremities, Temperature gradient within normal limits.  Neurology: Gross sensation intact via light touch bilateral, vibratory intact.  To light touch there is a focal numb spot noted to the right heel approximately 2 cm in diameter at the central aspect negative Tinel's or valleux sign bilateral  Musculoskeletal: No reproducible tenderness to palpation bilateral.  Range of motion appears to be within normal limits with no significant gross bony deformities noted.  Gait: Non-Antalgic gait  Xrays  Right foot   Impression: No acute osseous findings  Assessment and Plan: Problem List Items Addressed This Visit   None Visit Diagnoses     Pain in right foot    -  Primary   Neuritis            -Complete examination performed -Xrays reviewed -Discussed treatment options -Recommend patient to use heel  cups when in shoe to prevent worsening stress to the nerve at the plantar heel -Recommend good supportive shoes daily for foot type -Advised patient to try nervive nerve supplement -Advised patient to monitor symptoms if worsen or fail to improve to return to office -Patient to return to office as needed or sooner if condition worsens.  Asencion Islam, DPM

## 2020-12-03 ENCOUNTER — Other Ambulatory Visit (HOSPITAL_COMMUNITY): Payer: Self-pay

## 2021-01-28 ENCOUNTER — Other Ambulatory Visit (HOSPITAL_COMMUNITY): Payer: Self-pay

## 2021-01-28 MED ORDER — PANTOPRAZOLE SODIUM 40 MG PO TBEC
40.0000 mg | DELAYED_RELEASE_TABLET | Freq: Two times a day (BID) | ORAL | 4 refills | Status: DC
  Filled 2021-01-28: qty 60, 30d supply, fill #0

## 2021-02-01 ENCOUNTER — Other Ambulatory Visit (HOSPITAL_COMMUNITY): Payer: Self-pay

## 2021-06-09 ENCOUNTER — Ambulatory Visit (INDEPENDENT_AMBULATORY_CARE_PROVIDER_SITE_OTHER): Payer: No Typology Code available for payment source

## 2021-06-09 ENCOUNTER — Ambulatory Visit: Payer: No Typology Code available for payment source | Admitting: Podiatry

## 2021-06-09 ENCOUNTER — Other Ambulatory Visit: Payer: Self-pay

## 2021-06-09 ENCOUNTER — Encounter: Payer: Self-pay | Admitting: Podiatry

## 2021-06-09 DIAGNOSIS — M79671 Pain in right foot: Secondary | ICD-10-CM

## 2021-06-09 DIAGNOSIS — M79672 Pain in left foot: Secondary | ICD-10-CM

## 2021-06-09 DIAGNOSIS — M722 Plantar fascial fibromatosis: Secondary | ICD-10-CM

## 2021-06-09 MED ORDER — MELOXICAM 15 MG PO TABS: 15.0000 mg | ORAL_TABLET | Freq: Every day | ORAL | 2 refills | Status: DC

## 2021-06-09 MED ORDER — TRIAMCINOLONE ACETONIDE 10 MG/ML IJ SUSP
10.0000 mg | Freq: Once | INTRAMUSCULAR | Status: AC
  Administered 2021-06-09: 15:00:00 10 mg

## 2021-06-09 NOTE — Progress Notes (Signed)
Subjective:   Patient ID: Diane Bautista, female   DOB: 26 y.o.   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  Reappoint when orthotics return  X-rays indicate that there is no signs of bone spur formation currently no signs of stress fracture or other significant pathology

## 2021-06-22 ENCOUNTER — Telehealth: Payer: No Typology Code available for payment source | Admitting: Physician Assistant

## 2021-06-22 ENCOUNTER — Other Ambulatory Visit: Payer: Self-pay | Admitting: Physician Assistant

## 2021-06-22 ENCOUNTER — Ambulatory Visit
Admission: RE | Admit: 2021-06-22 | Discharge: 2021-06-22 | Disposition: A | Discharge status: Home / Self Care | Payer: No Typology Code available for payment source | Source: Ambulatory Visit | Attending: Family Medicine | Admitting: Family Medicine

## 2021-06-22 ENCOUNTER — Telehealth: Payer: No Typology Code available for payment source

## 2021-06-22 ENCOUNTER — Other Ambulatory Visit (HOSPITAL_COMMUNITY): Payer: Self-pay | Admitting: Physician Assistant

## 2021-06-22 ENCOUNTER — Other Ambulatory Visit: Payer: Self-pay

## 2021-06-22 VITALS — BP 124/79 | HR 93 | Temp 98.3°F | Resp 16

## 2021-06-22 DIAGNOSIS — R1013 Epigastric pain: Secondary | ICD-10-CM

## 2021-06-22 DIAGNOSIS — W5501XA Bitten by cat, initial encounter: Secondary | ICD-10-CM | POA: Diagnosis not present

## 2021-06-22 DIAGNOSIS — Z23 Encounter for immunization: Secondary | ICD-10-CM

## 2021-06-22 DIAGNOSIS — L03114 Cellulitis of left upper limb: Secondary | ICD-10-CM | POA: Diagnosis not present

## 2021-06-22 DIAGNOSIS — I891 Lymphangitis: Secondary | ICD-10-CM | POA: Diagnosis not present

## 2021-06-22 MED ORDER — TETANUS-DIPHTH-ACELL PERTUSSIS 5-2.5-18.5 LF-MCG/0.5 IM SUSY
0.5000 mL | PREFILLED_SYRINGE | Freq: Once | INTRAMUSCULAR | Status: AC
Start: 2021-06-22 — End: 2021-06-22
  Administered 2021-06-22: 0.5 mL via INTRAMUSCULAR

## 2021-06-22 MED ORDER — SULFAMETHOXAZOLE-TRIMETHOPRIM 800-160 MG PO TABS: 1.0000 | ORAL_TABLET | Freq: Two times a day (BID) | ORAL | 0 refills | Status: AC

## 2021-06-22 NOTE — Progress Notes (Signed)
Virtual Visit Consent   Diane Bautista, you are scheduled for a virtual visit with a [facility] provider today.     Just as with appointments in the office, your consent must be obtained to participate.  Your consent will be active for this visit and any virtual visit you may have with one of our providers in the next 365 days.     If you have a MyChart account, a copy of this consent can be sent to you electronically.  All virtual visits are billed to your insurance company just like a traditional visit in the office.    As this is a virtual visit, video technology does not allow for your provider to perform a traditional examination.  This may limit your provider's ability to fully assess your condition.  If your provider identifies any concerns that need to be evaluated in person or the need to arrange testing (such as labs, EKG, etc.), we will make arrangements to do so.     Although advances in technology are sophisticated, we cannot ensure that it will always work on either your end or our end.  If the connection with a video visit is poor, the visit may have to be switched to a telephone visit.  With either a video or telephone visit, we are not always able to ensure that we have a secure connection.     I need to obtain your verbal consent now.   Are you willing to proceed with your visit today?    Diane Bautista has provided verbal consent on 06/22/2021 for a virtual visit (video or telephone).   Diane Bautista, [place]   Date: 06/22/2021 4:46 PM   Virtual Visit via Video Note   I, Diane Bautista, connected with  Diane Bautista  ([id number], 1996-02-09) on 06/22/21 at  4:30 PM EST by a video-enabled telemedicine application and verified that I am speaking with the correct person using two identifiers.  Location: Patient: Virtual Visit Location Patient: Home Provider: Virtual Visit Location Provider: Home Office   I discussed the limitations of evaluation and management by  telemedicine and the availability of in person appointments. The patient expressed understanding and agreed to proceed.    History of Present Illness: Diane Bautista is a 26 y.o. who identifies as a female who was assigned female at birth, and is being seen today for cat bite of left hand sustained last night around 10 pm when she was brushing her cats fur. He bit into her left hand -- she noted pain and initial redness but not significant bleeding. Was fine when she went to bed. Today has had progressively worsening redness, warmth and tenderness at the site. Now noting a red streak from her hand to mid fore arm. And similar areas underneath her upper arm with axillary tenderness. Denies fever, chills, aches. Cat is up-to-date on immunizations. She endorses TDaP within past 5 years.   HPI: HPI  Problems:  Patient Active Problem List   Diagnosis Date Noted   Chronic migraine without aura without status migrainosus, not intractable 07/19/2019   Anxiety and depression 07/19/2019   Chronic migraine without aura, with intractable migraine, so stated, with status migrainosus 07/19/2019    Allergies: No Known Allergies Medications:  Current Outpatient Medications:    etonogestrel-ethinyl estradiol (NUVARING) 0.12-0.015 MG/24HR vaginal ring, Place 1 each vaginally every 28 (twenty-eight) days. Insert vaginally and leave in place for 3 consecutive weeks, then remove for 1 week., Disp: ,  Rfl:    hydrOXYzine (ATARAX/VISTARIL) 25 MG tablet, Take 1 tablet (25 mg total) by mouth every 6 (six) hours., Disp: 12 tablet, Rfl: 0   meloxicam (MOBIC) 15 MG tablet, Take 1 tablet (15 mg total) by mouth daily., Disp: 30 tablet, Rfl: 2   sucralfate (CARAFATE) 1 g tablet, Take 1 g by mouth 2 (two) times daily., Disp: , Rfl:   Observations/Objective: Patient is well-developed, well-nourished in no acute distress.  Resting comfortably at home.  Head is normocephalic, atraumatic.  No labored breathing. Speech is clear  and coherent with logical content.  Patient is alert and oriented at baseline.  Small puncture wounds from bite noted on dorsum of left lateral hand, just proximal to first MCP with surrounding redness. There is linear redness and streaking up the forearm and some scattered areas of redness tracking up the arm to axillary region.  Assessment and Plan: 1. Cat bite, initial encounter  2. Ascending lymphangitis  Pet. TDaP up-to-date. No concern for rabies. Biggest concern is for cellulitis with ascending lymphangitis. As such she need sin-person evaluation ASAP as she may require IM/IV antibiotic therapy before starting oral antibiotics. She is going to be seen at our nearest facility.  Follow Up Instructions: I discussed the assessment and treatment plan with the patient. The patient was provided an opportunity to ask questions and all were answered. The patient agreed with the plan and demonstrated an understanding of the instructions.  A copy of instructions were sent to the patient via MyChart unless otherwise noted below.   The patient was advised to call back or seek an in-person evaluation if the symptoms worsen or if the condition fails to improve as anticipated.  Time:  I spent 10 minutes with the patient via telehealth technology discussing the above problems/concerns.    Diane Rio, PA-C

## 2021-06-22 NOTE — Patient Instructions (Signed)
°  Derek Mound, thank you for joining [name], PA-C for today's virtual visit.  While this provider is not your primary care provider (PCP), if your PCP is located in our provider database this encounter information will be shared with them immediately following your visit.  Consent: (Patient) Korene MARKYA ROSSEN provided verbal consent for this virtual visit at the beginning of the encounter.  Current Medications:  Current Outpatient Medications:    etonogestrel-ethinyl estradiol (NUVARING) 0.12-0.015 MG/24HR vaginal ring, Place 1 each vaginally every 28 (twenty-eight) days. Insert vaginally and leave in place for 3 consecutive weeks, then remove for 1 week., Disp: , Rfl:    hydrOXYzine (ATARAX/VISTARIL) 25 MG tablet, Take 1 tablet (25 mg total) by mouth every 6 (six) hours., Disp: 12 tablet, Rfl: 0   meloxicam (MOBIC) 15 MG tablet, Take 1 tablet (15 mg total) by mouth daily., Disp: 30 tablet, Rfl: 2   sucralfate (CARAFATE) 1 g tablet, Take 1 g by mouth 2 (two) times daily., Disp: , Rfl:    Medications ordered in this encounter:  No orders of the defined types were placed in this encounter.    *If you need refills on other medications prior to your next appointment, please contact your pharmacy*  Follow-Up: Call back or seek an in-person evaluation if the symptoms worsen or if the condition fails to improve as anticipated.  Other Instructions Use link below to be seen at one of our facilities this evening. If no appointment listed, please be seen as a walk-in or if anything worsens, please be seen at the ER.    If you have been instructed to have an in-person evaluation today at a local Urgent Care facility, please use the link below. It will take you to a list of all of our available [facility], including address, phone number and hours of operation. Please do not delay care.  [facility]  If you or a family member do not have a primary care provider,  use the link below to schedule a visit and establish care. When you choose a [facility] primary care physician or advanced practice provider, you gain a long-term partner in health. Find a Primary Care Provider  Learn more about [facility]'s in-office and virtual care options: [facility]

## 2021-06-22 NOTE — ED Triage Notes (Signed)
Pt presents to the office for cat bite on left arm. The cat belongs to her and is vaccinated.  She c/o left arm pain and itchy red rash.

## 2021-06-22 NOTE — ED Provider Notes (Signed)
[facility]    CSN: [id number] Arrival date & time: 06/22/21  1800      History   Chief Complaint No chief complaint on file.   HPI Diane Bautista is a 26 y.o. female.   Presenting today with a cat bite to the left hand that occurred last night.  She states that the bite has been draining clear fluid, red around the area but now has some streaking redness up toward the axillary area on the left.  Denies fever, chills, numbness, tingling, swelling, decreased range of motion.  Has been keeping it clean, covered with Neosporin and a bandage.  The cat was her personal pet, up-to-date on vaccines.  Last tetanus 5 years ago.   Past Medical History:  Diagnosis Date   Anxiety    Depression    Migraine     Patient Active Problem List   Diagnosis Date Noted   Chronic migraine without aura without status migrainosus, not intractable 07/19/2019   Anxiety and depression 07/19/2019   Chronic migraine without aura, with intractable migraine, so stated, with status migrainosus 07/19/2019    Past Surgical History:  Procedure Laterality Date   WISDOM TOOTH EXTRACTION      OB History     Gravida  0   Para  0   Term  0   Preterm  0   AB  0   Living  0      SAB  0   IAB  0   Ectopic  0   Multiple  0   Live Births  0            Home Medications    Prior to Admission medications   Medication Sig Start Date End Date Taking? Authorizing Provider  sulfamethoxazole-trimethoprim (BACTRIM DS) 800-160 MG tablet Take 1 tablet by mouth 2 (two) times daily for 7 days. 06/22/21 06/29/21 Yes Particia Nearing, PA-C  etonogestrel-ethinyl estradiol (NUVARING) 0.12-0.015 MG/24HR vaginal ring Place 1 each vaginally every 28 (twenty-eight) days. Insert vaginally and leave in place for 3 consecutive weeks, then remove for 1 week.    [provider]  hydrOXYzine (ATARAX/VISTARIL) 25 MG tablet Take 1 tablet (25 mg total) by mouth every 6 (six) hours. 07/23/20    Farrel Gordon, PA-C  meloxicam (MOBIC) 15 MG tablet Take 1 tablet (15 mg total) by mouth daily. 06/09/21   Lenn Sink, DPM  sucralfate (CARAFATE) 1 g tablet Take 1 g by mouth 2 (two) times daily. 09/25/20   [provider]    Family History Family History  Problem Relation Age of Onset   Migraines Mother    Other Mother        borderline hypertension   High Cholesterol Father    Colon cancer Maternal Grandmother    Diabetes Paternal Grandmother    Depression Other     Social History Social History   Tobacco Use   Smoking status: Never   Smokeless tobacco: Never  Vaping Use   Vaping Use: Never used  Substance Use Topics   Alcohol use: Yes    Alcohol/week: 4.0 standard drinks    Types: 4 Glasses of wine per week   Drug use: No     Allergies   Patient has no known allergies.   Review of Systems Review of Systems Per HPI  Physical Exam Triage Vital Signs ED Triage Vitals [06/22/21 1812]  Enc Vitals Group     BP 124/79     Pulse Rate  93     Resp 16     Temp 98.3 F (36.8 C)     Temp Source Oral     SpO2 96 %     Weight      Height      Head Circumference      Peak Flow      Pain Score      Pain Loc      Pain Edu?      Excl. in GC?    No data found.  Updated Vital Signs BP 124/79 (BP Location: Left Arm)    Pulse 93    Temp 98.3 F (36.8 C) (Oral)    Resp 16    LMP 06/16/2021 (Approximate)    SpO2 96%   Visual Acuity Right Eye Distance:   Left Eye Distance:   Bilateral Distance:    Right Eye Near:   Left Eye Near:    Bilateral Near:     Physical Exam Vitals and nursing note reviewed.  Constitutional:      Appearance: Normal appearance. She is not ill-appearing.  HENT:     Head: Atraumatic.  Eyes:     Extraocular Movements: Extraocular movements intact.     Conjunctiva/sclera: Conjunctivae normal.  Cardiovascular:     Rate and Rhythm: Normal rate and regular rhythm.     Heart sounds: Normal heart sounds.  Pulmonary:      Effort: Pulmonary effort is normal.     Breath sounds: Normal breath sounds.  Musculoskeletal:        General: No swelling or tenderness. Normal range of motion.     Cervical back: Normal range of motion and neck supple.     Comments: Range of motion full and intact left upper extremity, strength full and intact.  Skin:    General: Skin is warm.     Findings: Erythema present.     Comments: Superficial puncture wound to the thenar surface of the left hand with surrounding erythema, small amount of red streaking up toward the left axilla.  Neurological:     Mental Status: She is alert and oriented to person, place, and time.     Comments: Left upper extremity neurovascularly intact  Psychiatric:        Mood and Affect: Mood normal.        Thought Content: Thought content normal.        Judgment: Judgment normal.   UC Treatments / Results  Labs (all labs ordered are listed, but only abnormal results are displayed) Labs Reviewed - No data to display  EKG  Radiology No results found.  Procedures Procedures (including critical care time)  Medications Ordered in UC Medications  Tdap (BOOSTRIX) injection 0.5 mL (has no administration in time range)    Initial Impression / Assessment and Plan / UC Course  I have reviewed the triage vital signs and the nursing notes.  Pertinent labs & imaging results that were available during my care of the patient were reviewed by me and considered in my medical decision making (see chart for details).     Will update Tdap as she is in the outer portion of the window for the 5-year injury guideline.  Start Bactrim, clean the wound twice daily and apply Neosporin, elevate the arm at rest.  Strict return precautions given if worsening or not improving.  Final Clinical Impressions(s) / UC Diagnoses   Final diagnoses:  Cellulitis of left upper extremity  Cat bite, initial encounter   Discharge  Instructions   None    ED Prescriptions      Medication Sig Dispense Auth. Provider   sulfamethoxazole-trimethoprim (BACTRIM DS) 800-160 MG tablet Take 1 tablet by mouth 2 (two) times daily for 7 days. 14 tablet Particia Nearing, [place]      PDMP not reviewed this encounter.   Roosvelt Maser [place], [place] 06/22/21 [phone]

## 2021-06-30 ENCOUNTER — Ambulatory Visit (HOSPITAL_COMMUNITY): Payer: No Typology Code available for payment source

## 2021-06-30 ENCOUNTER — Encounter (HOSPITAL_COMMUNITY): Payer: Self-pay

## 2021-06-30 ENCOUNTER — Other Ambulatory Visit (HOSPITAL_COMMUNITY): Payer: No Typology Code available for payment source

## 2021-07-06 ENCOUNTER — Telehealth: Payer: No Typology Code available for payment source | Admitting: Nurse Practitioner

## 2021-07-06 DIAGNOSIS — W5501XA Bitten by cat, initial encounter: Secondary | ICD-10-CM

## 2021-07-06 DIAGNOSIS — R21 Rash and other nonspecific skin eruption: Secondary | ICD-10-CM

## 2021-07-06 MED ORDER — AMOXICILLIN-POT CLAVULANATE 875-125 MG PO TABS: 1.0000 | ORAL_TABLET | Freq: Two times a day (BID) | ORAL | 0 refills | Status: DC

## 2021-07-06 NOTE — Progress Notes (Signed)
E Visit for Cellulitis  We are sorry that you are not feeling well. Here is how we plan to help!  Based on what you shared with me it looks like you have cellulitis.  Cellulitis looks like areas of skin redness, swelling, and warmth; it develops as a result of bacteria entering under the skin. Little red spots and/or bleeding can be seen in skin, and tiny surface sacs containing fluid can occur. Fever can be present. Cellulitis is almost always on one side of a body, and the lower limbs are the most common site of involvement.   I have prescribed:  Meds ordered this encounter  Medications   amoxicillin-clavulanate (AUGMENTIN) 875-125 MG tablet    Sig: Take 1 tablet by mouth 2 (two) times daily.    Dispense:  20 tablet    Refill:  0    Order Specific Question:   Supervising Provider    Answer:   Eber Hong [[id number]]     HOME CARE:  Take your medications as ordered and take all of them, even if the skin irritation appears to be healing.   GET HELP RIGHT AWAY IF:  Symptoms that don't begin to go away within 48 hours. Severe redness persists or worsens If the area turns color, spreads or swells. If it blisters and opens, develops yellow-brown crust or bleeds. You develop a fever or chills. If the pain increases or becomes unbearable.  Are unable to keep fluids and food down.  MAKE SURE YOU   Understand these instructions. Will watch your condition. Will get help right away if you are not doing well or get worse.  Thank you for choosing an e-visit.  Your e-visit answers were reviewed by a board certified advanced clinical practitioner to complete your personal care plan. Depending upon the condition, your plan could have included both over the counter or prescription medications.  Please review your pharmacy choice. Make sure the pharmacy is open so you can pick up prescription now. If there is a problem, you may contact your provider through [employer] and have the  prescription routed to another pharmacy.  Your safety is important to [place]. If you have drug allergies check your prescription carefully.   For the next 24 hours you can use MyChart to ask questions about today's visit, request a non-urgent call back, or ask for a work or school excuse. You will get an email in the next two days asking about your experience. I hope that your e-visit has been valuable and will speed your recovery.  5-10 minutes spent reviewing and documenting in chart.

## 2021-07-21 ENCOUNTER — Telehealth: Payer: Self-pay | Admitting: Podiatry

## 2021-07-21 NOTE — Telephone Encounter (Signed)
Lvm for patient to call and schedule an appointment to pick up orthotics  ?

## 2021-08-03 ENCOUNTER — Ambulatory Visit: Payer: No Typology Code available for payment source

## 2021-08-03 ENCOUNTER — Other Ambulatory Visit: Payer: Self-pay

## 2021-08-03 DIAGNOSIS — M722 Plantar fascial fibromatosis: Secondary | ICD-10-CM

## 2021-08-03 NOTE — Progress Notes (Signed)

## 2021-08-23 ENCOUNTER — Other Ambulatory Visit (HOSPITAL_COMMUNITY): Payer: Self-pay

## 2021-08-23 ENCOUNTER — Encounter: Payer: Self-pay | Admitting: Podiatry

## 2021-08-23 MED ORDER — FLUOXETINE HCL 20 MG PO TABS
20.0000 mg | ORAL_TABLET | Freq: Every day | ORAL | 4 refills | Status: DC
  Filled 2021-08-23: qty 30, 30d supply, fill #0
  Filled 2021-10-25 – 2022-03-19 (×2): qty 30, 30d supply, fill #1

## 2021-08-24 ENCOUNTER — Other Ambulatory Visit (HOSPITAL_COMMUNITY): Payer: Self-pay

## 2021-08-24 NOTE — Telephone Encounter (Signed)
Please advise 

## 2021-08-27 ENCOUNTER — Ambulatory Visit (INDEPENDENT_AMBULATORY_CARE_PROVIDER_SITE_OTHER): Payer: No Typology Code available for payment source | Admitting: Podiatry

## 2021-08-27 ENCOUNTER — Encounter: Payer: Self-pay | Admitting: Podiatry

## 2021-08-27 DIAGNOSIS — M722 Plantar fascial fibromatosis: Secondary | ICD-10-CM | POA: Diagnosis not present

## 2021-08-29 NOTE — Progress Notes (Signed)
Subjective:  ? ?Patient ID: Diane Bautista, female   DOB: 26 y.o.   MRN: [id number]  ? ?HPI ?Patient states she has had some improvement but continues to have problems especially after periods of sitting when getting up in the morning.  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 ? ?ROS ? ? ?   ?Objective:  ?Physical Exam  ?Neurovascular status intact discomfort that still is quite intense in the plantar heel right over left with inflammation and wearing orthotics currently ? ?   ?Assessment:  ?I reviewed this condition discussed her foot structure and its relationship to her pain and I have recommended we get a need to increase the stretch will continue to increase ice ? ?   ?Plan:  ?H&P reviewed at great length discussed the continuation of her symptoms and dispensed night splint with all instructions on usage along with heat ice therapy with this being dispensed to patient.  Reappoint for [place] to recheck in about 4 weeks or earlier if needed ?   ? ? ?

## 2021-10-25 ENCOUNTER — Other Ambulatory Visit (HOSPITAL_COMMUNITY): Payer: Self-pay

## 2021-10-25 MED ORDER — FLUOXETINE HCL 10 MG PO CAPS
10.0000 mg | ORAL_CAPSULE | Freq: Every day | ORAL | 3 refills | Status: DC
  Filled 2021-10-25: qty 90, 90d supply, fill #0
  Filled 2022-01-20: qty 90, 90d supply, fill #1
  Filled 2022-05-26: qty 90, 90d supply, fill #2

## 2021-10-26 ENCOUNTER — Other Ambulatory Visit (HOSPITAL_COMMUNITY): Payer: Self-pay

## 2022-01-20 ENCOUNTER — Other Ambulatory Visit (HOSPITAL_COMMUNITY): Payer: Self-pay

## 2022-02-28 ENCOUNTER — Telehealth: Payer: No Typology Code available for payment source | Admitting: Family

## 2022-02-28 DIAGNOSIS — J209 Acute bronchitis, unspecified: Secondary | ICD-10-CM | POA: Diagnosis not present

## 2022-02-28 MED ORDER — PREDNISONE 10 MG (21) PO TBPK: ORAL_TABLET | ORAL | 0 refills | Status: DC

## 2022-02-28 MED ORDER — BENZONATATE 100 MG PO CAPS
100.0000 mg | ORAL_CAPSULE | Freq: Three times a day (TID) | ORAL | 0 refills | Status: DC | PRN
Start: 2022-02-28 — End: 2022-03-05

## 2022-02-28 NOTE — Progress Notes (Signed)

## 2022-03-05 ENCOUNTER — Telehealth: Payer: No Typology Code available for payment source | Admitting: Physician Assistant

## 2022-03-05 DIAGNOSIS — B9689 Other specified bacterial agents as the cause of diseases classified elsewhere: Secondary | ICD-10-CM

## 2022-03-05 MED ORDER — BENZONATATE 100 MG PO CAPS: 100.0000 mg | ORAL_CAPSULE | Freq: Three times a day (TID) | ORAL | 0 refills | Status: DC | PRN

## 2022-03-05 MED ORDER — AZITHROMYCIN 250 MG PO TABS: ORAL_TABLET | ORAL | 0 refills | Status: DC

## 2022-03-05 NOTE — Addendum Note (Signed)
Addended by: Brunetta Jeans on: 03/05/2022 08:05 PM   Modules accepted: Orders

## 2022-03-05 NOTE — Progress Notes (Signed)

## 2022-03-05 NOTE — Progress Notes (Signed)
I have spent 5 minutes in review of e-visit questionnaire, review and updating patient chart, medical decision making and response to patient.   Vena Bassinger Cody Wilkes Potvin, PA-C    

## 2022-03-05 NOTE — Addendum Note (Signed)
Addended by: Brunetta Jeans on: 03/05/2022 07:34 PM   Modules accepted: Orders

## 2022-03-06 ENCOUNTER — Telehealth: Payer: No Typology Code available for payment source | Admitting: Nurse Practitioner

## 2022-03-06 ENCOUNTER — Other Ambulatory Visit: Payer: Self-pay | Admitting: Nurse Practitioner

## 2022-03-06 DIAGNOSIS — B9689 Other specified bacterial agents as the cause of diseases classified elsewhere: Secondary | ICD-10-CM

## 2022-03-06 MED ORDER — ALBUTEROL SULFATE HFA 108 (90 BASE) MCG/ACT IN AERS: 2.0000 | INHALATION_SPRAY | Freq: Four times a day (QID) | RESPIRATORY_TRACT | 0 refills | Status: AC | PRN

## 2022-03-06 MED ORDER — ALBUTEROL SULFATE HFA 108 (90 BASE) MCG/ACT IN AERS: 2.0000 | INHALATION_SPRAY | Freq: Four times a day (QID) | RESPIRATORY_TRACT | 0 refills | Status: DC | PRN

## 2022-03-06 MED ORDER — AZITHROMYCIN 250 MG PO TABS: ORAL_TABLET | ORAL | 0 refills | Status: DC

## 2022-03-06 NOTE — Progress Notes (Signed)
We are sorry that you are not feeling well.  Here is how we plan to help! I have sent an inhaler as requested.    From your responses in the eVisit questionnaire you describe inflammation in the upper respiratory tract which is causing a significant cough.  This is commonly called Bronchitis and has four common causes:   Allergies Viral Infections Acid Reflux Bacterial Infection Allergies, viruses and acid reflux are treated by controlling symptoms or eliminating the cause. An example might be a cough caused by taking certain blood pressure medications. You stop the cough by changing the medication. Another example might be a cough caused by acid reflux. Controlling the reflux helps control the cough.  USE OF BRONCHODILATOR ("RESCUE") INHALERS: There is a risk from using your bronchodilator too frequently.  The risk is that over-reliance on a medication which only relaxes the muscles surrounding the breathing tubes can reduce the effectiveness of medications prescribed to reduce swelling and congestion of the tubes themselves.  Although you feel brief relief from the bronchodilator inhaler, your asthma may actually be worsening with the tubes becoming more swollen and filled with mucus.  This can delay other crucial treatments, such as oral steroid medications. If you need to use a bronchodilator inhaler daily, several times per day, you should discuss this with your provider.  There are probably better treatments that could be used to keep your asthma under control.     HOME CARE Only take medications as instructed by your medical team. Complete the entire course of an antibiotic. Drink plenty of fluids and get plenty of rest. Avoid close contacts especially the very young and the elderly Cover your mouth if you cough or cough into your sleeve. Always remember to wash your hands A steam or ultrasonic humidifier can help congestion.   GET HELP RIGHT AWAY IF: You develop worsening fever. You  become short of breath You cough up blood. Your symptoms persist after you have completed your treatment plan MAKE SURE YOU  Understand these instructions. Will watch your condition. Will get help right away if you are not doing well or get worse.    Thank you for choosing an e-visit.  Your e-visit answers were reviewed by a board certified advanced clinical practitioner to complete your personal care plan. Depending upon the condition, your plan could have included both over the counter or prescription medications.  Please review your pharmacy choice. Make sure the pharmacy is open so you can pick up prescription now. If there is a problem, you may contact your provider through [employer] and have the prescription routed to another pharmacy.  Your safety is important to [place]. If you have drug allergies check your prescription carefully.   For the next 24 hours you can use MyChart to ask questions about today's visit, request a non-urgent call back, or ask for a work or school excuse. You will get an email in the next two days asking about your experience. I hope that your e-visit has been valuable and will speed your recovery.

## 2022-03-06 NOTE — Progress Notes (Signed)
I have spent 5 minutes in review of e-visit questionnaire, review and updating patient chart, medical decision making and response to patient.  ° °Presley Summerlin W Kashawna Manzer, NP ° °  °

## 2022-03-11 ENCOUNTER — Encounter (HOSPITAL_COMMUNITY): Payer: Self-pay

## 2022-03-11 ENCOUNTER — Ambulatory Visit (HOSPITAL_COMMUNITY)
Admission: EM | Admit: 2022-03-11 | Discharge: 2022-03-11 | Disposition: A | Discharge status: Home / Self Care | Payer: No Typology Code available for payment source | Attending: Emergency Medicine | Admitting: Emergency Medicine

## 2022-03-11 ENCOUNTER — Telehealth: Payer: No Typology Code available for payment source | Admitting: Physician Assistant

## 2022-03-11 ENCOUNTER — Ambulatory Visit (INDEPENDENT_AMBULATORY_CARE_PROVIDER_SITE_OTHER): Payer: No Typology Code available for payment source

## 2022-03-11 DIAGNOSIS — R0789 Other chest pain: Secondary | ICD-10-CM | POA: Diagnosis not present

## 2022-03-11 DIAGNOSIS — R052 Subacute cough: Secondary | ICD-10-CM

## 2022-03-11 DIAGNOSIS — R059 Cough, unspecified: Secondary | ICD-10-CM | POA: Diagnosis not present

## 2022-03-11 MED ORDER — IBUPROFEN 800 MG PO TABS: 800.0000 mg | ORAL_TABLET | Freq: Three times a day (TID) | ORAL | 0 refills | Status: AC

## 2022-03-11 NOTE — Progress Notes (Signed)
Because of your persistent cough the last several weeks, I feel your condition warrants further evaluation and I recommend that you be seen in a face to face visit. You will need to have your lungs examined and may need to have a chest xray.    NOTE: There will be NO CHARGE for this eVisit   If you are having a true medical emergency please call 911.      For an urgent face to face visit, [facility] has seven urgent care centers for your convenience:     [facility] [place] at  Get Driving Directions [phone] [place], [place] [address]    [facility]) Get Driving Directions [phone] [place], [place] [address]  [facility] [place] [facility]) Get Driving Directions [phone] [address] [place],  [place]  [address]  [facility] - at [address] [place] Get Driving Directions  [phone] [address] [place],  [place] [address]   [facility] Get Driving Directions [phone] [place], [place], [place] [address]   [facility] Get Driving Directions  [phone] [address] [place], [place] [address]   [facility] at [place] Get Driving Directions [phone] [address] [place], [place] [address]  Your MyChart E-visit questionnaire answers were reviewed by a board certified advanced clinical practitioner to complete your personal care plan based on your specific symptoms.  Thank you for using e-Visits.    Approximately 5 minutes was spent documenting and reviewing patient's chart.

## 2022-03-11 NOTE — Discharge Instructions (Addendum)
Use Delsym cough syrup (extended release dextromethorphan) and mucinex (generic is ok). Or you can continue using Robitussin DM (same medicines as Delsym + Mucinex but has to be taken more often/doesn't last as long).   Splint when you cough. Use the incentive spirometer to help you take deep breaths to prevent pneumonia.   Your cough may linger for another few weeks.

## 2022-03-11 NOTE — ED Triage Notes (Signed)
Patient has a cough and right flank pain. Patient has been taking OTC ibuprofen for the pain, helping somewhat.  No wheezing or SOB with cough. Cough is dry.

## 2022-03-14 ENCOUNTER — Telehealth: Payer: No Typology Code available for payment source | Admitting: Family

## 2022-03-14 DIAGNOSIS — J209 Acute bronchitis, unspecified: Secondary | ICD-10-CM | POA: Diagnosis not present

## 2022-03-14 DIAGNOSIS — M791 Myalgia, unspecified site: Secondary | ICD-10-CM

## 2022-03-14 MED ORDER — PREDNISONE 10 MG (21) PO TBPK: ORAL_TABLET | ORAL | 0 refills | Status: AC

## 2022-03-14 MED ORDER — BENZONATATE 100 MG PO CAPS: 100.0000 mg | ORAL_CAPSULE | Freq: Three times a day (TID) | ORAL | 0 refills | Status: DC | PRN

## 2022-03-14 NOTE — Progress Notes (Signed)
We are sorry that you are not feeling well.  Here is how we plan to help!  Based on your presentation I believe you most likely have A cough due to a virus.  This is called viral bronchitis and is best treated by rest, plenty of fluids and control of the cough.  You may use Ibuprofen or Tylenol as directed to help your symptoms.     In addition you may use A non-prescription cough medication called Robitussin DAC. Take 2 teaspoons every 8 hours or Delsym: take 2 teaspoons every 12 hours., A non-prescription cough medication called Mucinex DM: take 2 tablets every 12 hours., and A prescription cough medication called Tessalon Perles 100mg . You may take 1-2 capsules every 8 hours as needed for your cough.  Prednisone 10 mg daily for 6 days (see taper instructions below)  Directions for 6 day taper: Day 1: 2 tablets before breakfast, 1 after both lunch & dinner and 2 at bedtime Day 2: 1 tab before breakfast, 1 after both lunch & dinner and 2 at bedtime Day 3: 1 tab at each meal & 1 at bedtime Day 4: 1 tab at breakfast, 1 at lunch, 1 at bedtime Day 5: 1 tab at breakfast & 1 tab at bedtime Day 6: 1 tab at breakfast   This will hopefully help the cough and your muscle pain.   From your responses in the eVisit questionnaire you describe inflammation in the upper respiratory tract which is causing a significant cough.  This is commonly called Bronchitis and has four common causes:   Allergies Viral Infections Acid Reflux Bacterial Infection Allergies, viruses and acid reflux are treated by controlling symptoms or eliminating the cause. An example might be a cough caused by taking certain blood pressure medications. You stop the cough by changing the medication. Another example might be a cough caused by acid reflux. Controlling the reflux helps control the cough.  USE OF BRONCHODILATOR ("RESCUE") INHALERS: There is a risk from using your bronchodilator too frequently.  The risk is that  over-reliance on a medication which only relaxes the muscles surrounding the breathing tubes can reduce the effectiveness of medications prescribed to reduce swelling and congestion of the tubes themselves.  Although you feel brief relief from the bronchodilator inhaler, your asthma may actually be worsening with the tubes becoming more swollen and filled with mucus.  This can delay other crucial treatments, such as oral steroid medications. If you need to use a bronchodilator inhaler daily, several times per day, you should discuss this with your provider.  There are probably better treatments that could be used to keep your asthma under control.     HOME CARE Only take medications as instructed by your medical team. Complete the entire course of an antibiotic. Drink plenty of fluids and get plenty of rest. Avoid close contacts especially the very young and the elderly Cover your mouth if you cough or cough into your sleeve. Always remember to wash your hands A steam or ultrasonic humidifier can help congestion.   GET HELP RIGHT AWAY IF: You develop worsening fever. You become short of breath You cough up blood. Your symptoms persist after you have completed your treatment plan MAKE SURE YOU  Understand these instructions. Will watch your condition. Will get help right away if you are not doing well or get worse.    Thank you for choosing an e-visit.  Your e-visit answers were reviewed by a board certified advanced clinical practitioner to complete your  personal care plan. Depending upon the condition, your plan could have included both over the counter or prescription medications.  Please review your pharmacy choice. Make sure the pharmacy is open so you can pick up prescription now. If there is a problem, you may contact your provider through [employer] and have the prescription routed to another pharmacy.  Your safety is important to [place]. If you have drug allergies check your  prescription carefully.   For the next 24 hours you can use MyChart to ask questions about today's visit, request a non-urgent call back, or ask for a work or school excuse. You will get an email in the next two days asking about your experience. I hope that your e-visit has been valuable and will speed your recovery.  Approximately 5 minutes was spent documenting and reviewing patient's chart.

## 2022-03-21 ENCOUNTER — Encounter (HOSPITAL_COMMUNITY): Payer: Self-pay

## 2022-03-21 ENCOUNTER — Other Ambulatory Visit (HOSPITAL_COMMUNITY): Payer: Self-pay

## 2022-03-21 ENCOUNTER — Ambulatory Visit (HOSPITAL_COMMUNITY)
Admission: RE | Admit: 2022-03-21 | Discharge: 2022-03-21 | Disposition: A | Discharge status: Home / Self Care | Payer: No Typology Code available for payment source | Source: Ambulatory Visit | Attending: Family Medicine | Admitting: Family Medicine

## 2022-03-21 ENCOUNTER — Other Ambulatory Visit (HOSPITAL_COMMUNITY): Payer: Self-pay | Admitting: Family Medicine

## 2022-03-21 DIAGNOSIS — R059 Cough, unspecified: Secondary | ICD-10-CM | POA: Diagnosis present

## 2022-03-24 NOTE — ED Provider Notes (Signed)
[facility]    CSN: [id number] Arrival date & time: 03/11/22  1915      History   Chief Complaint Chief Complaint  Patient presents with   Cough   Flank Pain    HPI Diane Bautista is a 26 y.o. female. Patient has a cough and right lower rib pain. Patient has been taking OTC ibuprofen for the pain, helping somewhat.  No wheezing or SOB with cough. Cough is dry.   Has been sick since early October. Has had 4 virtual visits for same problem. Was declined for an evisit today and instructed to seek care in an urgent care. Was treated with zithromax, robitussin, prednisone, and tessalon perles. Feels she was getting better except having a lot of pain in R lower ribs with coughing. No fever. 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     Cough Flank Pain    Past Medical History:  Diagnosis Date   Anxiety    Depression    Migraine     Patient Active Problem List   Diagnosis Date Noted   Chronic migraine without aura without status migrainosus, not intractable 07/19/2019   Anxiety and depression 07/19/2019   Chronic migraine without aura, with intractable migraine, so stated, with status migrainosus 07/19/2019    Past Surgical History:  Procedure Laterality Date   WISDOM TOOTH EXTRACTION      OB History     Gravida  0   Para  0   Term  0   Preterm  0   AB  0   Living  0      SAB  0   IAB  0   Ectopic  0   Multiple  0   Live Births  0            Home Medications    Prior to Admission medications   Medication Sig Start Date End Date Taking? Authorizing Provider  etonogestrel-ethinyl estradiol (NUVARING) 0.12-0.015 MG/24HR vaginal ring Place 1 each vaginally every 28 (twenty-eight) days. Insert vaginally and leave in place for 3 consecutive weeks, then remove for 1 week.   Yes [provider]  ibuprofen (ADVIL) 800 MG tablet Take 1 tablet (800 mg total) by mouth 3 (three) times daily. 03/11/22  Yes Cathlyn Parsons, NP  albuterol (VENTOLIN  HFA) 108 (90 Base) MCG/ACT inhaler Inhale 2 puffs into the lungs every 6 (six) hours as needed for wheezing or shortness of breath. 03/06/22   Claiborne Rigg, NP  benzonatate (TESSALON PERLES) 100 MG capsule Take 1 capsule (100 mg total) by mouth 3 (three) times daily as needed. 03/14/22   Jannifer Rodney A, FNP  FLUoxetine (PROZAC) 10 MG capsule Take 1 capsule (10 mg total) by mouth daily. 09/24/21     FLUoxetine (PROZAC) 20 MG tablet Take 1 tablet (20 mg total) by mouth daily. 07/01/21     hydrOXYzine (ATARAX/VISTARIL) 25 MG tablet Take 1 tablet (25 mg total) by mouth every 6 (six) hours. 07/23/20   Farrel Gordon, PA-C  predniSONE (STERAPRED UNI-PAK 21 TAB) 10 MG (21) TBPK tablet Use as directed 03/14/22   Jannifer Rodney A, FNP  sucralfate (CARAFATE) 1 g tablet Take 1 g by mouth 2 (two) times daily. 09/25/20   [provider]    Family History Family History  Problem Relation Age of Onset   Migraines Mother    Other Mother        borderline hypertension   High Cholesterol Father    Colon  cancer Maternal Grandmother    Diabetes Paternal Grandmother    Depression Other     Social History Social History   Tobacco Use   Smoking status: Never   Smokeless tobacco: Never  Vaping Use   Vaping Use: Never used  Substance Use Topics   Alcohol use: Yes    Alcohol/week: 4.0 standard drinks of alcohol    Types: 4 Glasses of wine per week   Drug use: No     Allergies   Patient has no known allergies.   Review of Systems Review of Systems  Respiratory:  Positive for cough.   Genitourinary:  Positive for flank pain.     Physical Exam Triage Vital Signs ED Triage Vitals  Enc Vitals Group     BP 03/11/22 2010 113/77     Pulse Rate 03/11/22 2010 (!) 102     Resp 03/11/22 2010 16     Temp 03/11/22 2010 98.1 F (36.7 C)     Temp Source 03/11/22 2010 Oral     SpO2 03/11/22 2010 100 %     Weight --      Height --      Head Circumference --      Peak Flow --      Pain  Score 03/11/22 2008 5     Pain Loc --      Pain Edu? --      Excl. in [place]? --    No data found.  Updated Vital Signs BP 113/77 (BP Location: Right Arm)   Pulse (!) 102   Temp 98.1 F (36.7 C) (Oral)   Resp 16   SpO2 100%   Visual Acuity Right Eye Distance:   Left Eye Distance:   Bilateral Distance:    Right Eye Near:   Left Eye Near:    Bilateral Near:     Physical Exam Constitutional:      General: She is not in acute distress.    Appearance: Normal appearance. She is not ill-appearing.  HENT:     Right Ear: Tympanic membrane, ear canal and external ear normal.     Left Ear: Tympanic membrane, ear canal and external ear normal.     Nose: Nose normal.     Mouth/Throat:     Mouth: Mucous membranes are moist.     Pharynx: Oropharynx is clear.  Cardiovascular:     Rate and Rhythm: Normal rate and regular rhythm.  Pulmonary:     Effort: Pulmonary effort is normal.     Breath sounds: Normal breath sounds.     Comments: Occasional dry cough Chest:    Neurological:     Mental Status: She is alert.      UC Treatments / Results  Labs (all labs ordered are listed, but only abnormal results are displayed) Labs Reviewed - No data to display  EKG   Radiology No results found.  Procedures Procedures (including critical care time)  Medications Ordered in UC Medications - No data to display  Initial Impression / Assessment and Plan / UC Course  I have reviewed the triage vital signs and the nursing notes.  Pertinent labs & imaging results that were available during my care of the patient were reviewed by me and considered in my medical decision making (see chart for details).    CXR reassuring. Given incentive spirometer and instructions for use. Supportive care measures discussed.   Final Clinical Impressions(s) / UC Diagnoses   Final diagnoses:  Subacute cough  Anterior  chest wall pain     Discharge Instructions      Use Delsym cough syrup  (extended release dextromethorphan) and mucinex (generic is ok). Or you can continue using Robitussin DM (same medicines as Delsym + Mucinex but has to be taken more often/doesn't last as long).   Splint when you cough. Use the incentive spirometer to help you take deep breaths to prevent pneumonia.   Your cough may linger for another few weeks.    ED Prescriptions     Medication Sig Dispense Auth. Provider   ibuprofen (ADVIL) 800 MG tablet Take 1 tablet (800 mg total) by mouth 3 (three) times daily. 21 tablet Cathlyn Parsons, NP      PDMP not reviewed this encounter.   Cathlyn Parsons, NP 03/24/22 1139

## 2022-05-26 ENCOUNTER — Other Ambulatory Visit (HOSPITAL_COMMUNITY): Payer: Self-pay

## 2022-06-17 ENCOUNTER — Other Ambulatory Visit (HOSPITAL_COMMUNITY): Payer: Self-pay

## 2022-06-17 MED ORDER — FLUOXETINE HCL 10 MG PO CAPS
10.0000 mg | ORAL_CAPSULE | Freq: Every day | ORAL | 3 refills | Status: DC
  Filled 2022-06-17 (×3): qty 90, 90d supply, fill #0
  Filled 2022-10-14 – 2022-10-19 (×2): qty 90, 90d supply, fill #1
  Filled 2023-04-03 – 2023-04-24 (×2): qty 90, 90d supply, fill #2

## 2022-06-17 MED ORDER — ALPRAZOLAM 0.5 MG PO TABS
0.5000 mg | ORAL_TABLET | ORAL | 2 refills | Status: AC | PRN
  Filled 2022-06-17: qty 10, 3d supply, fill #0

## 2022-06-20 ENCOUNTER — Other Ambulatory Visit (HOSPITAL_COMMUNITY): Payer: Self-pay

## 2022-07-03 ENCOUNTER — Ambulatory Visit
Admission: EM | Admit: 2022-07-03 | Discharge: 2022-07-03 | Disposition: A | Discharge status: Home / Self Care | Payer: 59 | Attending: Nurse Practitioner | Admitting: Nurse Practitioner

## 2022-07-03 DIAGNOSIS — B349 Viral infection, unspecified: Secondary | ICD-10-CM | POA: Diagnosis not present

## 2022-07-03 DIAGNOSIS — J029 Acute pharyngitis, unspecified: Secondary | ICD-10-CM | POA: Diagnosis not present

## 2022-07-03 LAB — POCT INFLUENZA A/B
Influenza A, POC: NEGATIVE
Influenza B, POC: NEGATIVE

## 2022-07-03 LAB — POCT RAPID STREP A (OFFICE): Rapid Strep A Screen: NEGATIVE

## 2022-07-03 MED ORDER — LIDOCAINE VISCOUS HCL 2 % MT SOLN: 5.0000 mL | Freq: Four times a day (QID) | OROMUCOSAL | 0 refills | Status: DC | PRN

## 2022-07-03 NOTE — ED Provider Notes (Signed)
[facility]    CSN: [id number] Arrival date & time: 07/03/22  0802      History   Chief Complaint Chief Complaint  Patient presents with   Sore Throat    HPI Diane Bautista is a 27 y.o. female.   The history is provided by the patient.   The patient presents for complaints of chills, body aches, sore throat, and nausea.  Symptoms have been present for the past 24 hours.  Patient denies fever, headache, ear pain, cough, abdominal pain, vomiting, or diarrhea.  Patient reports that she feels that her nausea has improved since it started.  She also reports that she had a lot of mucus in her throat earlier today which made her cough.  She denies any obvious known sick contacts.  She reports she has not taken any medication for her symptoms.  Past Medical History:  Diagnosis Date   Anxiety    Depression    Migraine     Patient Active Problem List   Diagnosis Date Noted   Chronic migraine without aura without status migrainosus, not intractable 07/19/2019   Anxiety and depression 07/19/2019   Chronic migraine without aura, with intractable migraine, so stated, with status migrainosus 07/19/2019    Past Surgical History:  Procedure Laterality Date   WISDOM TOOTH EXTRACTION      OB History     Gravida  0   Para  0   Term  0   Preterm  0   AB  0   Living  0      SAB  0   IAB  0   Ectopic  0   Multiple  0   Live Births  0            Home Medications    Prior to Admission medications   Medication Sig Start Date End Date Taking? Authorizing Provider  lidocaine (XYLOCAINE) 2 % solution Use as directed 5 mLs in the mouth or throat every 6 (six) hours as needed for mouth pain. Gargle and spit 5 mL every 6 hours as needed for throat pain or discomfort. 07/03/22  Yes Linzy Darling-Warren, Alda Lea, NP  albuterol (VENTOLIN HFA) 108 (90 Base) MCG/ACT inhaler Inhale 2 puffs into the lungs every 6 (six) hours as needed for wheezing or shortness of breath.  03/06/22   Gildardo Pounds, NP  ALPRAZolam Duanne Moron) 0.5 MG tablet 1/2-1 tablet by mouth every 6 hours as needed 06/17/22     benzonatate (TESSALON PERLES) 100 MG capsule Take 1 capsule (100 mg total) by mouth 3 (three) times daily as needed. 03/14/22   Sharion Balloon, FNP  etonogestrel-ethinyl estradiol (NUVARING) 0.12-0.015 MG/24HR vaginal ring Place 1 each vaginally every 28 (twenty-eight) days. Insert vaginally and leave in place for 3 consecutive weeks, then remove for 1 week.    [provider]  FLUoxetine (PROZAC) 10 MG capsule Take 1 capsule (10 mg total) by mouth daily. 09/24/21     FLUoxetine (PROZAC) 10 MG capsule Take 1 capsule (10 mg total) by mouth daily. 06/17/22     FLUoxetine (PROZAC) 20 MG tablet Take 1 tablet (20 mg total) by mouth daily. 07/01/21     hydrOXYzine (ATARAX/VISTARIL) 25 MG tablet Take 1 tablet (25 mg total) by mouth every 6 (six) hours. 07/23/20   [name], PA-C  ibuprofen (ADVIL) 800 MG tablet Take 1 tablet (800 mg total) by mouth 3 (three) times daily. 03/11/22   Carvel Getting, NP  predniSONE (  STERAPRED UNI-PAK 21 TAB) 10 MG (21) TBPK tablet Use as directed 03/14/22   Evelina Dun A, FNP  sucralfate (CARAFATE) 1 g tablet Take 1 g by mouth 2 (two) times daily. 09/25/20   [provider]    Family History Family History  Problem Relation Age of Onset   Migraines Mother    Other Mother        borderline hypertension   High Cholesterol Father    Colon cancer Maternal Grandmother    Diabetes Paternal Grandmother    Depression Other     Social History Social History   Tobacco Use   Smoking status: Never   Smokeless tobacco: Never  Vaping Use   Vaping Use: Never used  Substance Use Topics   Alcohol use: Yes    Alcohol/week: 4.0 standard drinks of alcohol    Types: 4 Glasses of wine per week   Drug use: No     Allergies   Sulfa antibiotics   Review of Systems Review of Systems Per HPI  Physical Exam Triage Vital  Signs ED Triage Vitals [07/03/22 0811]  Enc Vitals Group     BP 110/73     Pulse Rate 89     Resp 16     Temp 98.7 F (37.1 C)     Temp Source Oral     SpO2 97 %     Weight      Height      Head Circumference      Peak Flow      Pain Score 8     Pain Loc      Pain Edu?      Excl. in [place]?    No data found.  Updated Vital Signs BP 110/73 (BP Location: Right Arm)   Pulse 89   Temp 98.7 F (37.1 C) (Oral)   Resp 16   LMP  (Within Months) Comment: 1 month  SpO2 97%   Visual Acuity Right Eye Distance:   Left Eye Distance:   Bilateral Distance:    Right Eye Near:   Left Eye Near:    Bilateral Near:     Physical Exam Vitals and nursing note reviewed.  Constitutional:      General: She is not in acute distress. HENT:     Head: Normocephalic and atraumatic.     Right Ear: Tympanic membrane and ear canal normal.     Left Ear: Tympanic membrane and ear canal normal.     Nose: No congestion or rhinorrhea.     Mouth/Throat:     Pharynx: Uvula midline. Pharyngeal swelling and posterior oropharyngeal erythema present. No uvula swelling.     Tonsils: No tonsillar exudate. 1+ on the right. 1+ on the left.  Eyes:     Conjunctiva/sclera: Conjunctivae normal.  Cardiovascular:     Rate and Rhythm: Normal rate and regular rhythm.     Heart sounds: No murmur heard. Pulmonary:     Effort: Pulmonary effort is normal. No respiratory distress.     Breath sounds: Normal breath sounds.  Abdominal:     Palpations: Abdomen is soft.     Tenderness: There is no abdominal tenderness.  Musculoskeletal:        General: No swelling.     Cervical back: Normal range of motion.  Lymphadenopathy:     Cervical: No cervical adenopathy.  Skin:    General: Skin is warm and dry.     Capillary Refill: Capillary refill takes less than 2 seconds.  Neurological:     General: No focal deficit present.     Mental Status: She is alert.  Psychiatric:        Mood and Affect: Mood normal.         Behavior: Behavior normal.      UC Treatments / Results  Labs (all labs ordered are listed, but only abnormal results are displayed) Labs Reviewed  CULTURE, GROUP A STREP [facility])  POCT RAPID STREP A (OFFICE)  POCT INFLUENZA A/B    EKG   Radiology No results found.  Procedures Procedures (including critical care time)  Medications Ordered in UC Medications - No data to display  Initial Impression / Assessment and Plan / UC Course  I have reviewed the triage vital signs and the nursing notes.  Pertinent labs & imaging results that were available during my care of the patient were reviewed by me and considered in my medical decision making (see chart for details).  The patient is well-appearing, she is in no acute distress, vital signs are stable.  Rapid strep test was negative, influenza test was negative, COVID test is pending.    Patient with less than 24-hour history of viral illness.  Rapid strep test was negative, throat culture is pending.  Patient with negative home COVID test after symptoms started, no indication retest at this time..  Influenza test was also negative.  Suspect a viral illness at this time.  Supportive care recommendations were provided to the patient to include increasing fluids, allowing for plenty of rest, use of over-the-counter analgesics for pain.  For symptomatic treatment, viscous lidocaine 2% was prescribed to help with patient's throat pain or discomfort.  Discussed viral illness with the patient and when follow-up may be indicated.  Patient verbalizes understanding.  All questions were answered.  Patient stable for discharge.    Final Clinical Impressions(s) / UC Diagnoses   Final diagnoses:  Viral illness  Sore throat     Discharge Instructions      The rapid strep test and influenza test were negative.  Throat culture is pending at this time.  You will be contacted if the pending test results are positive.   Take medication as  prescribed. Increase fluids and allow for plenty of rest. Warm salt water gargles 3-4 times daily while throat pain persist. Recommend a soft diet while throat pain persist to include soup, broth, yogurt, pudding, popsicles and Jell-O. Please be advised that a viral illness can last from 7 to 14 days.  If symptoms suddenly worsen before that time, or extend beyond that time, please follow-up in this clinic or with your primary care physician for further evaluation. Follow-up as needed.     ED Prescriptions     Medication Sig Dispense Auth. Provider   lidocaine (XYLOCAINE) 2 % solution Use as directed 5 mLs in the mouth or throat every 6 (six) hours as needed for mouth pain. Gargle and spit 5 mL every 6 hours as needed for throat pain or discomfort. 100 mL Allard Lightsey-Warren, Alda Lea, NP      PDMP not reviewed this encounter.   Tish Men, NP 07/03/22 [phone]

## 2022-07-03 NOTE — Discharge Instructions (Addendum)
The rapid strep test and influenza test were negative.  Throat culture is pending at this time.  You will be contacted if the pending test results are positive.   Take medication as prescribed. Increase fluids and allow for plenty of rest. Warm salt water gargles 3-4 times daily while throat pain persist. Recommend a soft diet while throat pain persist to include soup, broth, yogurt, pudding, popsicles and Jell-O. Please be advised that a viral illness can last from 7 to 14 days.  If symptoms suddenly worsen before that time, or extend beyond that time, please follow-up in this clinic or with your primary care physician for further evaluation. Follow-up as needed.

## 2022-07-03 NOTE — ED Triage Notes (Signed)
Pt reports sore throat, body aches, chills, nausea  x 1 day.

## 2022-07-06 LAB — CULTURE, GROUP A STREP (THRC)

## 2022-10-14 ENCOUNTER — Other Ambulatory Visit (HOSPITAL_COMMUNITY): Payer: Self-pay

## 2022-10-19 ENCOUNTER — Other Ambulatory Visit: Payer: Self-pay

## 2022-10-19 ENCOUNTER — Other Ambulatory Visit (HOSPITAL_COMMUNITY): Payer: Self-pay

## 2022-10-20 ENCOUNTER — Other Ambulatory Visit (HOSPITAL_COMMUNITY): Payer: Self-pay

## 2023-01-02 ENCOUNTER — Other Ambulatory Visit (HOSPITAL_COMMUNITY): Payer: Self-pay | Admitting: Family Medicine

## 2023-01-02 ENCOUNTER — Ambulatory Visit (HOSPITAL_COMMUNITY)
Admission: RE | Admit: 2023-01-02 | Discharge: 2023-01-02 | Disposition: A | Discharge status: Home / Self Care | Payer: 59 | Source: Ambulatory Visit | Attending: Family Medicine | Admitting: Family Medicine

## 2023-01-02 DIAGNOSIS — M25561 Pain in right knee: Secondary | ICD-10-CM | POA: Diagnosis not present

## 2023-01-02 DIAGNOSIS — M222X1 Patellofemoral disorders, right knee: Secondary | ICD-10-CM

## 2023-01-21 ENCOUNTER — Other Ambulatory Visit (HOSPITAL_COMMUNITY): Payer: Self-pay

## 2023-01-21 IMAGING — CT CT ANGIO HEAD
2 of 8 series · 8 of 33 positions shown · IV contrast (Omnipaque or Isovue)
Comparison: MRI 07/23/2019

CLINICAL DATA: Neck pain, throat pain and headache.



[Series 8: cta head & neck · axial · 0.40mm/px · z∈[-127,+110]mm · 6 of 666 slices shown]
[im 96/666  soft-tissue]
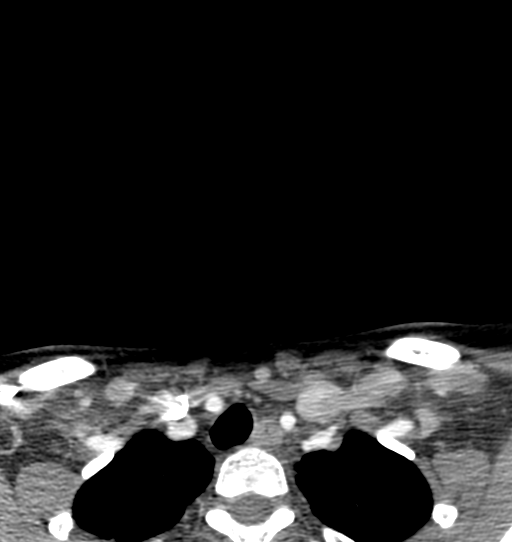
[im 191/666  bone]
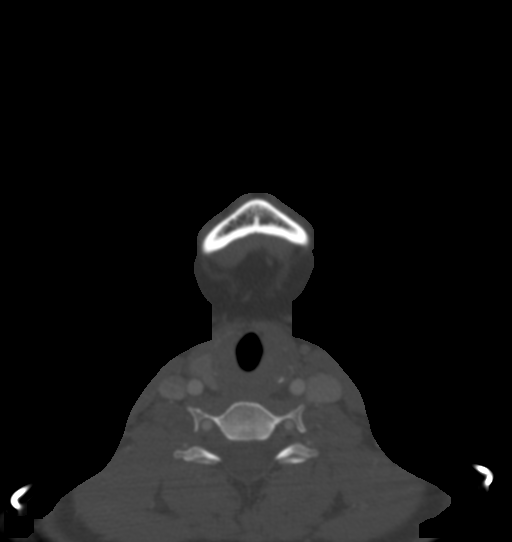
[im 286/666  soft-tissue]
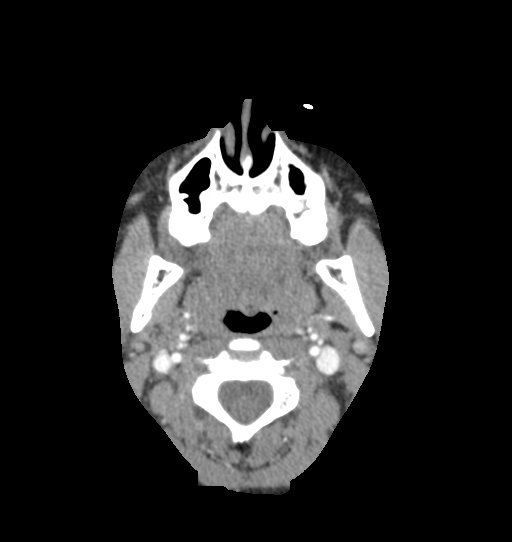
[im 381/666  bone]
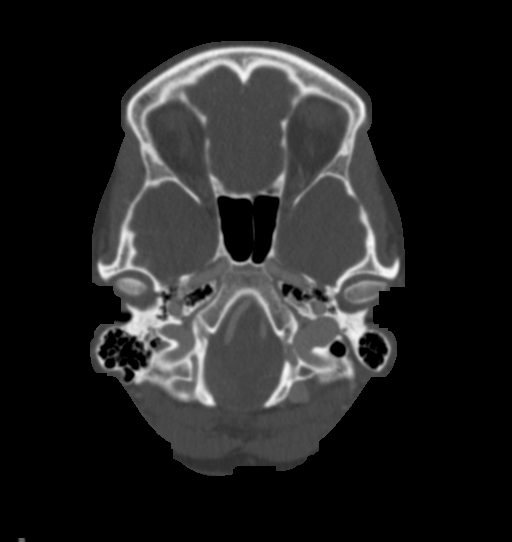
[im 476/666  soft-tissue]
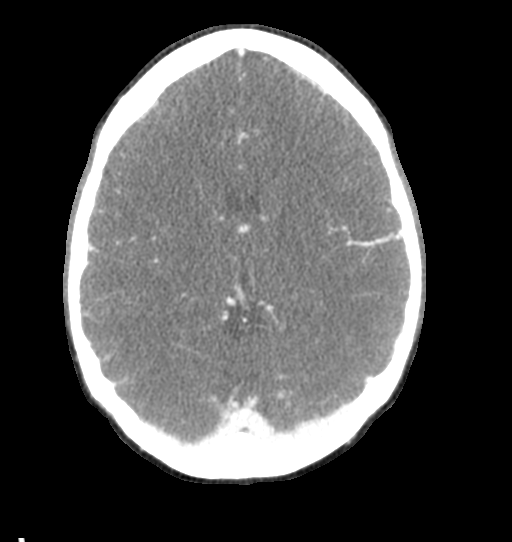
[im 571/666  bone]
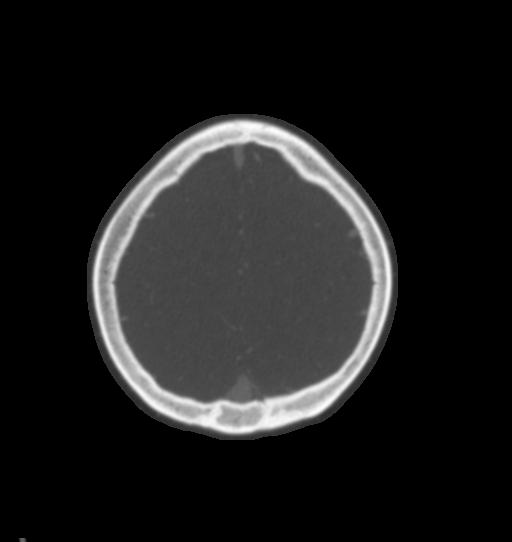

[Series 9: ax thins · axial · 0.35mm/px · z∈[-64,+47]mm · 2 of 333 slices shown]
[im 111/333  soft-tissue]
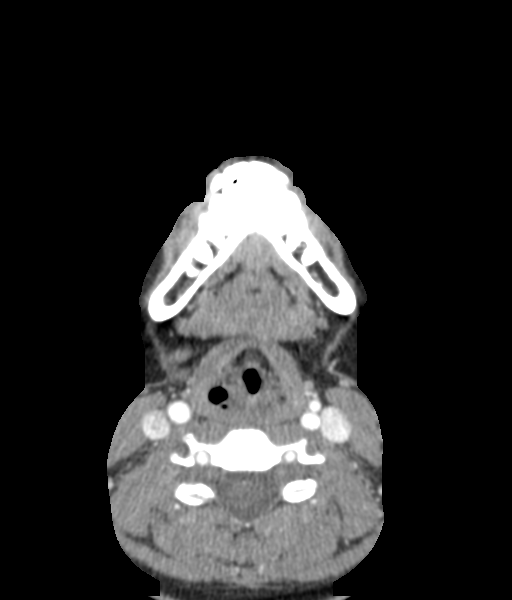
[im 222/333  soft-tissue]
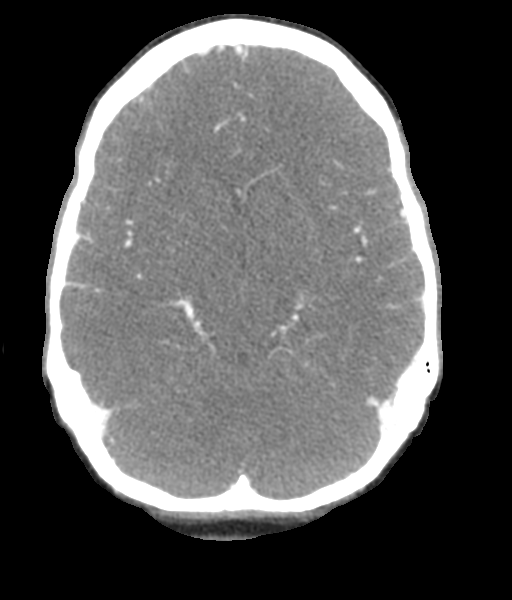

[8 of 33 positions shown; findings below may reference images not displayed]

FINDINGS: CTA NECK FINDINGS

Aortic arch: Normal

Right carotid system: Normal. No dissection or atherosclerotic
disease.

Left carotid system: Normal. No dissection or atherosclerotic
disease.

Vertebral arteries: Both vertebral artery origins are normal. Both
vertebral arteries are normal through the cervical region to the
foramen magnum. No evidence of dissection.

Skeleton: Normal

Other neck: No mass or lymphadenopathy.

Upper chest: Normal

Review of the MIP images confirms the above findings

CTA HEAD FINDINGS

Anterior circulation: Both internal carotid arteries are patent
through the skull base and siphon regions. Anterior and middle
cerebral vessels are normal. No sign of anterior circulation
intracranial aneurysm, vascular malformation or branch vessel
occlusion.

Posterior circulation: Both vertebral arteries widely patent to the
basilar. No basilar stenosis. Posterior circulation branch vessels
are normal.

Venous sinuses: Patent and normal.

Anatomic variants: None significant.

Brain parenchyma appears normal without evidence of malformation,
old or acute infarction, mass lesion, hemorrhage, hydrocephalus or
extra-axial collection sinuses clear. Orbits appear normal.

Review of the MIP images confirms the above findings
IMPRESSION: Normal CT angiography of the neck and head. No sign of dissection or
other vascular pathology.

## 2023-01-21 MED ORDER — ETONOGESTREL-ETHINYL ESTRADIOL 0.12-0.015 MG/24HR VA RING
VAGINAL_RING | VAGINAL | 1 refills | Status: AC
  Filled 2023-01-21: qty 3, 28d supply, fill #0
  Filled 2023-04-03: qty 3, 28d supply, fill #1

## 2023-02-06 ENCOUNTER — Encounter (HOSPITAL_COMMUNITY): Payer: Self-pay

## 2023-02-06 ENCOUNTER — Other Ambulatory Visit (HOSPITAL_COMMUNITY): Payer: Self-pay

## 2023-04-03 ENCOUNTER — Other Ambulatory Visit (HOSPITAL_COMMUNITY): Payer: Self-pay

## 2023-04-03 ENCOUNTER — Other Ambulatory Visit: Payer: Self-pay

## 2023-04-06 ENCOUNTER — Other Ambulatory Visit: Payer: Self-pay

## 2023-04-24 ENCOUNTER — Other Ambulatory Visit (HOSPITAL_COMMUNITY): Payer: Self-pay

## 2023-07-16 ENCOUNTER — Other Ambulatory Visit (HOSPITAL_COMMUNITY): Payer: Self-pay

## 2023-07-26 ENCOUNTER — Other Ambulatory Visit (HOSPITAL_COMMUNITY): Payer: Self-pay

## 2023-07-27 ENCOUNTER — Other Ambulatory Visit (HOSPITAL_COMMUNITY): Payer: Self-pay

## 2023-07-27 ENCOUNTER — Other Ambulatory Visit: Payer: Self-pay

## 2023-07-27 MED ORDER — FLUOXETINE HCL 10 MG PO CAPS
10.0000 mg | ORAL_CAPSULE | Freq: Every day | ORAL | 0 refills | Status: DC
  Filled 2023-07-27: qty 90, 90d supply, fill #0

## 2023-09-14 DIAGNOSIS — Z0001 Encounter for general adult medical examination with abnormal findings: Secondary | ICD-10-CM | POA: Diagnosis not present

## 2023-09-14 DIAGNOSIS — F419 Anxiety disorder, unspecified: Secondary | ICD-10-CM | POA: Diagnosis not present

## 2023-09-14 DIAGNOSIS — F320 Major depressive disorder, single episode, mild: Secondary | ICD-10-CM | POA: Diagnosis not present

## 2023-10-19 ENCOUNTER — Other Ambulatory Visit (HOSPITAL_COMMUNITY): Payer: Self-pay

## 2023-11-02 ENCOUNTER — Other Ambulatory Visit (HOSPITAL_COMMUNITY): Payer: Self-pay

## 2023-11-21 ENCOUNTER — Other Ambulatory Visit: Payer: Self-pay

## 2023-11-21 ENCOUNTER — Other Ambulatory Visit (HOSPITAL_COMMUNITY): Payer: Self-pay

## 2023-11-21 MED ORDER — FLUOXETINE HCL 10 MG PO CAPS
10.0000 mg | ORAL_CAPSULE | Freq: Every day | ORAL | 1 refills | Status: DC
  Filled 2023-11-21: qty 90, 90d supply, fill #0
  Filled 2024-02-15: qty 90, 90d supply, fill #1

## 2024-01-09 DIAGNOSIS — N911 Secondary amenorrhea: Secondary | ICD-10-CM | POA: Diagnosis not present

## 2024-01-30 DIAGNOSIS — Z3685 Encounter for antenatal screening for Streptococcus B: Secondary | ICD-10-CM | POA: Diagnosis not present

## 2024-01-30 DIAGNOSIS — Z3401 Encounter for supervision of normal first pregnancy, first trimester: Secondary | ICD-10-CM | POA: Diagnosis not present

## 2024-01-30 DIAGNOSIS — Z3A11 11 weeks gestation of pregnancy: Secondary | ICD-10-CM | POA: Diagnosis not present

## 2024-01-30 DIAGNOSIS — Z3481 Encounter for supervision of other normal pregnancy, first trimester: Secondary | ICD-10-CM | POA: Diagnosis not present

## 2024-01-30 DIAGNOSIS — Z3182 Encounter for Rh incompatibility status: Secondary | ICD-10-CM | POA: Diagnosis not present

## 2024-02-06 DIAGNOSIS — Z3A12 12 weeks gestation of pregnancy: Secondary | ICD-10-CM | POA: Diagnosis not present

## 2024-02-06 DIAGNOSIS — Z113 Encounter for screening for infections with a predominantly sexual mode of transmission: Secondary | ICD-10-CM | POA: Diagnosis not present

## 2024-02-06 DIAGNOSIS — Z369 Encounter for antenatal screening, unspecified: Secondary | ICD-10-CM | POA: Diagnosis not present

## 2024-02-06 DIAGNOSIS — Z3400 Encounter for supervision of normal first pregnancy, unspecified trimester: Secondary | ICD-10-CM | POA: Diagnosis not present

## 2024-02-06 DIAGNOSIS — Z124 Encounter for screening for malignant neoplasm of cervix: Secondary | ICD-10-CM | POA: Diagnosis not present

## 2024-02-08 LAB — HM PAP SMEAR: HM Pap smear: NORMAL

## 2024-02-15 ENCOUNTER — Other Ambulatory Visit (HOSPITAL_COMMUNITY): Payer: Self-pay

## 2024-03-04 DIAGNOSIS — Z361 Encounter for antenatal screening for raised alphafetoprotein level: Secondary | ICD-10-CM | POA: Diagnosis not present

## 2024-03-21 DIAGNOSIS — O26829 Pregnancy related peripheral neuritis, unspecified trimester: Secondary | ICD-10-CM | POA: Diagnosis not present

## 2024-03-21 DIAGNOSIS — M5440 Lumbago with sciatica, unspecified side: Secondary | ICD-10-CM | POA: Diagnosis not present

## 2024-04-02 DIAGNOSIS — Z3400 Encounter for supervision of normal first pregnancy, unspecified trimester: Secondary | ICD-10-CM | POA: Diagnosis not present

## 2024-04-02 DIAGNOSIS — Z363 Encounter for antenatal screening for malformations: Secondary | ICD-10-CM | POA: Diagnosis not present

## 2024-04-02 DIAGNOSIS — Z3A20 20 weeks gestation of pregnancy: Secondary | ICD-10-CM | POA: Diagnosis not present

## 2024-06-12 ENCOUNTER — Other Ambulatory Visit (HOSPITAL_COMMUNITY): Payer: Self-pay

## 2024-06-12 ENCOUNTER — Encounter: Payer: Self-pay | Admitting: *Deleted

## 2024-06-12 ENCOUNTER — Ambulatory Visit

## 2024-06-12 VITALS — BP 114/73 | HR 96 | Temp 98.1°F | Ht 62.0 in | Wt 178.8 lb

## 2024-06-12 DIAGNOSIS — R5383 Other fatigue: Secondary | ICD-10-CM | POA: Diagnosis not present

## 2024-06-12 DIAGNOSIS — Z0001 Encounter for general adult medical examination with abnormal findings: Secondary | ICD-10-CM | POA: Diagnosis not present

## 2024-06-12 DIAGNOSIS — F419 Anxiety disorder, unspecified: Secondary | ICD-10-CM | POA: Diagnosis not present

## 2024-06-12 DIAGNOSIS — Z13220 Encounter for screening for lipoid disorders: Secondary | ICD-10-CM | POA: Diagnosis not present

## 2024-06-12 DIAGNOSIS — F32A Depression, unspecified: Secondary | ICD-10-CM | POA: Diagnosis not present

## 2024-06-12 DIAGNOSIS — Z0000 Encounter for general adult medical examination without abnormal findings: Secondary | ICD-10-CM

## 2024-06-12 MED ORDER — FLUOXETINE HCL 10 MG PO CAPS
10.0000 mg | ORAL_CAPSULE | Freq: Every day | ORAL | 3 refills | Status: AC
  Filled 2024-06-12: qty 90, 90d supply, fill #0

## 2024-06-12 NOTE — Progress Notes (Signed)
 "  New Patient Office Visit  Subjective    Patient ID: Diane Bautista, female    DOB: 08-30-1995  Age: 29 y.o. MRN: [id number]  HPI Diane Bautista presents to establish care and for annual physical.   The patient comes in today for a wellness visit.  A review of their health history was completed. A review of medications was also completed.  Any needed refills; needs refill on Prozac   Eating habits: Good  Falls/  MVA accidents in past few months: No  Regular exercise: Yes, is very active at her job  Sleep: Good  Menstrual cycles/sexual history: Patient is currently [redacted] weeks pregnant. Sexually active with 1 female partner.   Specialist pt sees on regular basis: Follows up with [facility] for OB appointments.  Regular eye/dental exams: Yes  Preventative health issues were discussed.   Additional concerns: None   Past Medical History:  Diagnosis Date   Anxiety    Depression    Migraine     Past Surgical History:  Procedure Laterality Date   WISDOM TOOTH EXTRACTION      Family History  Problem Relation Age of Onset   Migraines Mother    Other Mother        borderline hypertension   High Cholesterol Father    Colon cancer Maternal Grandmother    Diabetes Paternal Grandmother    Depression Other     Social History   Socioeconomic History   Marital status: Single    Spouse name: Not on file   Number of children: Not on file   Years of education: Not on file   Highest education level: Associate degree: occupational, scientist, product/process development, or vocational program  Occupational History   Not on file  Tobacco Use   Smoking status: Never   Smokeless tobacco: Never  Vaping Use   Vaping status: Never Used  Substance and Sexual Activity   Alcohol use: Yes    Alcohol/week: 4.0 standard drinks of alcohol    Types: 4 Glasses of wine per week   Drug use: No   Sexual activity: Yes    Birth control/protection: Inserts    Comment: nuvaring  Other Topics Concern   Not on  file  Social History Narrative   Lives at home alone   Right handed   Caffeine: diet mtn dew in the morning or another soda/coffee (up to 20 oz), also drinks 8 oz can mid-day while working. She gets tension headaches in the back of her head if she doesn't drink caffeine.   Social Drivers of Health   Tobacco Use: Low Risk (06/12/2024)   Patient History    Smoking Tobacco Use: Never    Smokeless Tobacco Use: Never    Passive Exposure: Not on file  Financial Resource Strain: Not on file  Food Insecurity: Not on file  Transportation Needs: Not on file  Physical Activity: Not on file  Stress: Not on file  Social Connections: Not on file  Intimate Partner Violence: Not on file  Depression (PHQ2-9): Low Risk (06/12/2024)   Depression (PHQ2-9)    PHQ-2 Score: 0  Alcohol Screen: Not on file  Housing: Not on file  Utilities: Not on file  Health Literacy: Not on file   Review of Systems  Constitutional:  Negative for fatigue and fever.  Eyes:  Negative for visual disturbance.  Respiratory:  Negative for cough, shortness of breath and wheezing.   Cardiovascular:  Negative for chest pain.  Gastrointestinal:  Negative for constipation,  diarrhea, nausea and vomiting.  Genitourinary:  Negative for difficulty urinating.  Neurological:  Negative for headaches.  Psychiatric/Behavioral:  Negative for sleep disturbance. The patient is not nervous/anxious.    Objective    BP 114/73   Pulse 96   Temp 98.1 F (36.7 C)   Ht 5' 2 (1.575 m)   Wt 178 lb 12.8 oz (81.1 kg)   SpO2 97%   BMI 32.70 kg/m   Physical Exam Vitals and nursing note reviewed.  Constitutional:      General: She is not in acute distress.    Appearance: Normal appearance. She is not ill-appearing.  Cardiovascular:     Rate and Rhythm: Normal rate and regular rhythm.     Heart sounds: Normal heart sounds, S1 normal and S2 normal. No murmur heard. Pulmonary:     Effort: Pulmonary effort is normal. No respiratory  distress.     Breath sounds: Normal breath sounds. No wheezing.  Abdominal:     Palpations: Abdomen is soft.  Musculoskeletal:     Right lower leg: No edema.     Left lower leg: No edema.  Lymphadenopathy:     Cervical: No cervical adenopathy.  Skin:    General: Skin is warm and dry.  Neurological:     Mental Status: She is alert. Mental status is at baseline.  Psychiatric:        Mood and Affect: Mood normal.        Behavior: Behavior normal.        Thought Content: Thought content normal.        Judgment: Judgment normal.       06/12/2024   10:44 AM  Depression screen PHQ 2/9  Decreased Interest 0  Down, Depressed, Hopeless 0  PHQ - 2 Score 0  Altered sleeping 0  Tired, decreased energy 0  Change in appetite 0  Feeling bad or failure about yourself  0  Trouble concentrating 0  Moving slowly or fidgety/restless 0  Suicidal thoughts 0  PHQ-9 Score 0  Difficult doing work/chores Not difficult at all       06/12/2024   10:44 AM  GAD 7 : Generalized Anxiety Score  Nervous, Anxious, on Edge 0  Control/stop worrying 0  Worry too much - different things 0  Trouble relaxing 0  Restless 0  Easily annoyed or irritable 0  Afraid - awful might happen 0  Total GAD 7 Score 0  Anxiety Difficulty Not difficult at all   Assessment & Plan:  1. Well woman exam without gynecological exam (Primary) Adult wellness-complete.wellness physical was conducted today. Importance of diet and exercise were discussed in detail.  Importance of stress reduction and healthy living were discussed.  In addition to this a discussion regarding safety was also covered.  We also reviewed over immunizations and gave recommendations regarding current immunization needed for age.   In addition to this additional areas were also touched on including: Preventative health exams needed: None. Completed pap smear 02/06/24.   Patient was advised yearly wellness exam  - Comprehensive metabolic panel with  GFR  2. Anxiety and depression - Stable on current regimen. Refilled medication.  - FLUoxetine  (PROZAC ) 10 MG capsule; Take 1 capsule (10 mg total) by mouth daily.  Dispense: 90 capsule; Refill: 3  3. Fatigue, unspecified type - CBC with Differential/Platelet - TSH + free T4  4. Screening for lipid disorders - Lipid panel   Return in about 1 year (around 06/12/2025) for physical.   Damien  KATHEE Pringle, FNP  Note:  This document was prepared using Dragon voice recognition software and may include unintentional dictation errors.   "

## 2024-07-31 ENCOUNTER — Ambulatory Visit: Admitting: Physician Assistant
